# Patient Record
Sex: Female | Born: 1960
Health system: Southern US, Community
[De-identification: ages and names within clinical notes are randomized; demographics above are authoritative.]

## PROBLEM LIST (undated history)

## (undated) DIAGNOSIS — G43909 Migraine, unspecified, not intractable, without status migrainosus: Secondary | ICD-10-CM

## (undated) DIAGNOSIS — J45909 Unspecified asthma, uncomplicated: Secondary | ICD-10-CM

## (undated) DIAGNOSIS — D699 Hemorrhagic condition, unspecified: Secondary | ICD-10-CM

## (undated) DIAGNOSIS — F411 Generalized anxiety disorder: Secondary | ICD-10-CM

## (undated) DIAGNOSIS — F329 Major depressive disorder, single episode, unspecified: Secondary | ICD-10-CM

## (undated) DIAGNOSIS — E785 Hyperlipidemia, unspecified: Secondary | ICD-10-CM

## (undated) DIAGNOSIS — J309 Allergic rhinitis, unspecified: Secondary | ICD-10-CM

## (undated) DIAGNOSIS — D509 Iron deficiency anemia, unspecified: Secondary | ICD-10-CM

## (undated) DIAGNOSIS — K439 Ventral hernia without obstruction or gangrene: Secondary | ICD-10-CM

## (undated) HISTORY — DX: Ventral hernia without obstruction or gangrene: K43.9

## (undated) HISTORY — DX: Hemorrhagic condition, unspecified: D69.9

## (undated) HISTORY — DX: Allergic rhinitis, unspecified: J30.9

## (undated) HISTORY — PX: COLONOSCOPY: SHX174

## (undated) HISTORY — DX: Migraine, unspecified, not intractable, without status migrainosus: G43.909

## (undated) HISTORY — PX: ABDOMINAL HYSTERECTOMY: SHX81

## (undated) HISTORY — DX: Hyperlipidemia, unspecified: E78.5

## (undated) HISTORY — DX: Iron deficiency anemia, unspecified: D50.9

## (undated) HISTORY — DX: Unspecified asthma, uncomplicated: J45.909

## (undated) HISTORY — DX: Generalized anxiety disorder: F41.1

## (undated) HISTORY — DX: Major depressive disorder, single episode, unspecified: F32.9

---

## 1998-05-23 ENCOUNTER — Ambulatory Visit (HOSPITAL_COMMUNITY): Admission: RE | Admit: 1998-05-23 | Discharge: 1998-05-23 | Payer: Self-pay | Admitting: Obstetrics & Gynecology

## 1998-05-23 ENCOUNTER — Encounter: Payer: Self-pay | Admitting: Obstetrics & Gynecology

## 1999-05-09 ENCOUNTER — Other Ambulatory Visit: Admission: RE | Admit: 1999-05-09 | Discharge: 1999-05-09 | Payer: Self-pay | Admitting: Obstetrics and Gynecology

## 1999-12-21 ENCOUNTER — Encounter: Payer: Self-pay | Admitting: Obstetrics & Gynecology

## 1999-12-21 ENCOUNTER — Ambulatory Visit (HOSPITAL_COMMUNITY): Admission: RE | Admit: 1999-12-21 | Discharge: 1999-12-21 | Payer: Self-pay | Admitting: Obstetrics & Gynecology

## 2000-09-16 ENCOUNTER — Other Ambulatory Visit: Admission: RE | Admit: 2000-09-16 | Discharge: 2000-09-16 | Payer: Self-pay | Admitting: Obstetrics and Gynecology

## 2001-12-18 ENCOUNTER — Other Ambulatory Visit: Admission: RE | Admit: 2001-12-18 | Discharge: 2001-12-18 | Payer: Self-pay | Admitting: Obstetrics and Gynecology

## 2002-04-27 ENCOUNTER — Encounter (INDEPENDENT_AMBULATORY_CARE_PROVIDER_SITE_OTHER): Payer: Self-pay | Admitting: Specialist

## 2002-04-27 ENCOUNTER — Observation Stay (HOSPITAL_COMMUNITY): Admission: RE | Admit: 2002-04-27 | Discharge: 2002-04-28 | Payer: Self-pay | Admitting: Obstetrics and Gynecology

## 2002-07-19 ENCOUNTER — Ambulatory Visit (HOSPITAL_COMMUNITY): Admission: RE | Admit: 2002-07-19 | Discharge: 2002-07-19 | Payer: Self-pay | Admitting: Obstetrics and Gynecology

## 2002-07-19 ENCOUNTER — Encounter: Payer: Self-pay | Admitting: Obstetrics and Gynecology

## 2003-03-22 ENCOUNTER — Other Ambulatory Visit: Admission: RE | Admit: 2003-03-22 | Discharge: 2003-03-22 | Payer: Self-pay | Admitting: Obstetrics and Gynecology

## 2004-05-14 ENCOUNTER — Ambulatory Visit: Payer: Self-pay | Admitting: Internal Medicine

## 2005-05-16 ENCOUNTER — Ambulatory Visit: Payer: Self-pay | Admitting: Internal Medicine

## 2005-05-21 ENCOUNTER — Ambulatory Visit: Payer: Self-pay | Admitting: Internal Medicine

## 2005-07-17 ENCOUNTER — Ambulatory Visit: Payer: Self-pay | Admitting: Internal Medicine

## 2005-09-30 ENCOUNTER — Ambulatory Visit: Payer: Self-pay | Admitting: Internal Medicine

## 2005-10-02 ENCOUNTER — Encounter: Admission: RE | Admit: 2005-10-02 | Discharge: 2005-10-02 | Payer: Self-pay | Admitting: Internal Medicine

## 2006-02-03 ENCOUNTER — Ambulatory Visit: Payer: Self-pay | Admitting: Internal Medicine

## 2006-10-07 ENCOUNTER — Ambulatory Visit (HOSPITAL_COMMUNITY): Admission: RE | Admit: 2006-10-07 | Discharge: 2006-10-07 | Payer: Self-pay | Admitting: Internal Medicine

## 2006-10-07 DIAGNOSIS — E669 Obesity, unspecified: Secondary | ICD-10-CM | POA: Insufficient documentation

## 2006-10-13 DIAGNOSIS — J45909 Unspecified asthma, uncomplicated: Secondary | ICD-10-CM | POA: Insufficient documentation

## 2006-10-13 DIAGNOSIS — J309 Allergic rhinitis, unspecified: Secondary | ICD-10-CM | POA: Insufficient documentation

## 2006-10-13 DIAGNOSIS — F411 Generalized anxiety disorder: Secondary | ICD-10-CM | POA: Insufficient documentation

## 2006-10-13 DIAGNOSIS — Z860100 Personal history of colon polyps, unspecified: Secondary | ICD-10-CM | POA: Insufficient documentation

## 2006-10-13 DIAGNOSIS — D509 Iron deficiency anemia, unspecified: Secondary | ICD-10-CM

## 2006-10-13 DIAGNOSIS — F329 Major depressive disorder, single episode, unspecified: Secondary | ICD-10-CM

## 2006-10-13 DIAGNOSIS — E785 Hyperlipidemia, unspecified: Secondary | ICD-10-CM | POA: Insufficient documentation

## 2006-10-13 DIAGNOSIS — F418 Other specified anxiety disorders: Secondary | ICD-10-CM | POA: Insufficient documentation

## 2006-10-13 DIAGNOSIS — F3289 Other specified depressive episodes: Secondary | ICD-10-CM

## 2006-10-13 HISTORY — DX: Unspecified asthma, uncomplicated: J45.909

## 2006-10-13 HISTORY — DX: Major depressive disorder, single episode, unspecified: F32.9

## 2006-10-13 HISTORY — DX: Generalized anxiety disorder: F41.1

## 2006-10-13 HISTORY — DX: Iron deficiency anemia, unspecified: D50.9

## 2006-10-13 HISTORY — DX: Other specified depressive episodes: F32.89

## 2006-10-13 HISTORY — DX: Hyperlipidemia, unspecified: E78.5

## 2006-10-13 HISTORY — DX: Allergic rhinitis, unspecified: J30.9

## 2006-10-16 ENCOUNTER — Ambulatory Visit: Payer: Self-pay | Admitting: Internal Medicine

## 2007-05-04 ENCOUNTER — Ambulatory Visit: Payer: Self-pay | Admitting: Internal Medicine

## 2007-05-04 DIAGNOSIS — I8000 Phlebitis and thrombophlebitis of superficial vessels of unspecified lower extremity: Secondary | ICD-10-CM | POA: Insufficient documentation

## 2007-12-02 ENCOUNTER — Ambulatory Visit (HOSPITAL_COMMUNITY): Admission: RE | Admit: 2007-12-02 | Discharge: 2007-12-02 | Payer: Self-pay | Admitting: Internal Medicine

## 2007-12-09 ENCOUNTER — Encounter: Payer: Self-pay | Admitting: Internal Medicine

## 2007-12-11 ENCOUNTER — Encounter: Admission: RE | Admit: 2007-12-11 | Discharge: 2007-12-11 | Payer: Self-pay | Admitting: Internal Medicine

## 2007-12-11 ENCOUNTER — Encounter: Payer: Self-pay | Admitting: Internal Medicine

## 2008-01-29 ENCOUNTER — Telehealth: Payer: Self-pay | Admitting: Internal Medicine

## 2008-10-03 ENCOUNTER — Telehealth: Payer: Self-pay | Admitting: Internal Medicine

## 2008-10-05 ENCOUNTER — Ambulatory Visit: Payer: Self-pay | Admitting: Internal Medicine

## 2008-10-05 DIAGNOSIS — N61 Mastitis without abscess: Secondary | ICD-10-CM | POA: Insufficient documentation

## 2008-10-17 ENCOUNTER — Telehealth: Payer: Self-pay | Admitting: Internal Medicine

## 2008-12-07 ENCOUNTER — Ambulatory Visit (HOSPITAL_COMMUNITY): Admission: RE | Admit: 2008-12-07 | Discharge: 2008-12-07 | Payer: Self-pay | Admitting: Internal Medicine

## 2009-06-28 ENCOUNTER — Ambulatory Visit: Payer: Self-pay | Admitting: Internal Medicine

## 2009-06-28 LAB — CONVERTED CEMR LAB
Albumin: 4.1 g/dL (ref 3.5–5.2)
Basophils Absolute: 0 10*3/uL (ref 0.0–0.1)
Basophils Relative: 0.9 % (ref 0.0–3.0)
CO2: 30 meq/L (ref 19–32)
Calcium: 9.6 mg/dL (ref 8.4–10.5)
Chloride: 103 meq/L (ref 96–112)
Creatinine, Ser: 0.6 mg/dL (ref 0.4–1.2)
Eosinophils Absolute: 0.1 10*3/uL (ref 0.0–0.7)
Glucose, Bld: 80 mg/dL (ref 70–99)
HDL: 43.4 mg/dL (ref >39.00)
Ketones, ur: NEGATIVE mg/dL
MCHC: 34.2 g/dL (ref 30.0–36.0)
MCV: 87.9 fL (ref 78.0–100.0)
Monocytes Absolute: 0.4 10*3/uL (ref 0.1–1.0)
Neutro Abs: 2.6 10*3/uL (ref 1.4–7.7)
Neutrophils Relative %: 60 % (ref 43.0–77.0)
RBC: 4.37 M/uL (ref 3.87–5.11)
RDW: 13.4 % (ref 11.5–14.6)
Specific Gravity, Urine: >=1.03 (ref 1.000–1.030)
Total Protein, Urine: NEGATIVE mg/dL
Total Protein: 6.8 g/dL (ref 6.0–8.3)
Triglycerides: 53 mg/dL (ref 0.0–149.0)
Urine Glucose: NEGATIVE mg/dL
VLDL: 10.6 mg/dL (ref 0.0–40.0)
pH: 5.5 (ref 5.0–8.0)

## 2009-11-10 ENCOUNTER — Ambulatory Visit: Payer: Self-pay | Admitting: Internal Medicine

## 2009-11-10 DIAGNOSIS — M25519 Pain in unspecified shoulder: Secondary | ICD-10-CM | POA: Insufficient documentation

## 2009-11-10 DIAGNOSIS — M25529 Pain in unspecified elbow: Secondary | ICD-10-CM | POA: Insufficient documentation

## 2009-12-27 ENCOUNTER — Ambulatory Visit (HOSPITAL_COMMUNITY): Admission: RE | Admit: 2009-12-27 | Discharge: 2009-12-27 | Payer: Self-pay | Admitting: Internal Medicine

## 2010-01-16 ENCOUNTER — Encounter: Payer: Self-pay | Admitting: Internal Medicine

## 2010-01-17 ENCOUNTER — Encounter: Admission: RE | Admit: 2010-01-17 | Discharge: 2010-01-17 | Payer: Self-pay | Admitting: Internal Medicine

## 2010-01-17 LAB — HM MAMMOGRAPHY: HM Mammogram: NORMAL

## 2010-02-26 ENCOUNTER — Telehealth: Payer: Self-pay | Admitting: Internal Medicine

## 2010-03-11 ENCOUNTER — Encounter: Payer: Self-pay | Admitting: Internal Medicine

## 2010-03-16 ENCOUNTER — Ambulatory Visit
Admission: RE | Admit: 2010-03-16 | Discharge: 2010-03-16 | Payer: Self-pay | Source: Home / Self Care | Attending: Internal Medicine | Admitting: Internal Medicine

## 2010-03-16 ENCOUNTER — Encounter: Payer: Self-pay | Admitting: Internal Medicine

## 2010-03-16 DIAGNOSIS — R002 Palpitations: Secondary | ICD-10-CM | POA: Insufficient documentation

## 2010-03-16 DIAGNOSIS — R350 Frequency of micturition: Secondary | ICD-10-CM | POA: Insufficient documentation

## 2010-03-16 DIAGNOSIS — R21 Rash and other nonspecific skin eruption: Secondary | ICD-10-CM | POA: Insufficient documentation

## 2010-03-16 LAB — CONVERTED CEMR LAB
Blood in Urine, dipstick: NEGATIVE
Glucose, Urine, Semiquant: NEGATIVE
Ketones, urine, test strip: NEGATIVE
Protein, U semiquant: NEGATIVE
Specific Gravity, Urine: 1.01
WBC Urine, dipstick: NEGATIVE

## 2010-03-19 ENCOUNTER — Telehealth: Payer: Self-pay | Admitting: Internal Medicine

## 2010-03-19 DIAGNOSIS — N644 Mastodynia: Secondary | ICD-10-CM | POA: Insufficient documentation

## 2010-03-19 DIAGNOSIS — N6009 Solitary cyst of unspecified breast: Secondary | ICD-10-CM | POA: Insufficient documentation

## 2010-03-20 NOTE — Assessment & Plan Note (Signed)
Summary: feels like she has "irregular" heartbeat/cd   Vital Signs:  Patient profile:   50 year old female Height:      65 inches Weight:      163 pounds BMI:     27.22 O2 Sat:      99 % on Room air Temp:     98.3 degrees F oral Pulse rate:   62 / minute Pulse rhythm:   regular BP sitting:   102 / 64  (left arm) Cuff size:   regular  Vitals Entered By: Bill Salinas CMA (Jun 28, 2009 10:56 AM)  O2 Flow:  Room air  Preventive Care Screening     declines tetanus today; plans to cont yearly mammograms  CC: pt here with compliant of heart beating irregular or palpatations when angry or anxious/ ab   CC:  pt here with compliant of heart beating irregular or palpatations when angry or anxious/ ab.  History of Present Illness: here with above, with emot upset more often recent due to family stress increased recently;  no symptoms at other ties; same symtpoms better with xanax a few yrs ago;  Pt denies CP, sob, doe, wheezing, orthopnea, pnd, worsening LE edema,  or syncope Pt denies new neuro symptoms such as headache, facial or extremity weakness   No other new complaints.  Denies overt bleeding, or other hyper or hypothyroid symtpoms.  Denies worsening depressive symtpoms, suicidal ideation or panic.    Preventive Screening-Counseling & Management  Alcohol-Tobacco     Smoking Status: never      Drug Use:  no.    Problems Prior to Update: 1)  Preventive Health Care  (ICD-V70.0) 2)  Abscess, Breast, Right  (ICD-611.0) 3)  Superficial Thrombophlebitis  (ICD-451.0) 4)  Depression  (ICD-311) 5)  Colonic Polyps, Hx of  (ICD-V12.72) 6)  Asthma  (ICD-493.90) 7)  Anxiety  (ICD-300.00) 8)  Allergic Rhinitis  (ICD-477.9) 9)  Hyperlipidemia  (ICD-272.4) 10)  Anemia-iron Deficiency  (ICD-280.9) 11)  Obesity  (ICD-278.00)  Medications Prior to Update: 1)  Doxycycline Hyclate 100 Mg Caps (Doxycycline Hyclate) .Marland Kitchen.. 1po Two Times A Day  Current Medications (verified): 1)  Alprazolam  0.25 Mg Tabs (Alprazolam) .Marland Kitchen.. 1 By Mouth Two Times A Day As Needed  Allergies (verified): 1)  ! Aspirin 2)  Zoloft  Comments:  Nurse/Medical Assistant: The patient's medications were reviewed with the patient and were updated in the Medication List. Ami Bullins CMA (Jun 28, 2009 10:56 AM)  Past History:  Past Medical History: Last updated: 10/07/2006 Anemia-iron deficiency Hyperlipidemia Allergic rhinitis Anxiety Asthma Colonic polyps, hx of Depression  Past Surgical History: Last updated: 10/07/2006 Hysterectomy  Family History: Last updated: 06/28/2009 no DVT HTN  Social History: Last updated: 06/28/2009 Married has children Never Smoked Alcohol use-yes - social Drug use-no  Risk Factors: Smoking Status: never (06/28/2009)  Family History: Reviewed history from 05/04/2007 and no changes required. no DVT HTN  Social History: Reviewed history from 10/05/2008 and no changes required. Married has children Never Smoked Alcohol use-yes - social Drug use-no Drug Use:  no  Review of Systems  The patient denies anorexia, fever, weight loss, weight gain, vision loss, decreased hearing, hoarseness, chest pain, syncope, dyspnea on exertion, peripheral edema, prolonged cough, headaches, hemoptysis, abdominal pain, melena, hematochezia, severe indigestion/heartburn, hematuria, muscle weakness, suspicious skin lesions, transient blindness, difficulty walking, depression, unusual weight change, abnormal bleeding, enlarged lymph nodes, angioedema, and breast masses.         all otherwise negative per pt -  Physical Exam  General:  alert and well-developed.   Head:  normocephalic and atraumatic.   Eyes:  vision grossly intact, pupils equal, and pupils round.   Ears:  R ear normal and L ear normal.   Nose:  no external deformity and no nasal discharge.   Mouth:  no gingival abnormalities and pharynx pink and moist.   Neck:  supple and no masses.   Lungs:   normal respiratory effort and normal breath sounds.   Heart:  normal rate and regular rhythm.   Abdomen:  soft, non-tender, and normal bowel sounds.   Msk:  no joint tenderness and no joint swelling.   Extremities:  no edema, no erythema  Neurologic:  cranial nerves II-XII intact and strength normal in all extremities.     Impression & Recommendations:  Problem # 1:  Preventive Health Care (ICD-V70.0)  Overall doing well, age appropriate education and counseling updated and referral for appropriate preventive services done unless declined, immunizations up to date or declined, diet counseling done if overweight, urged to quit smoking if smokes , most recent labs reviewed and current ordered if appropriate, ecg reviewed or declined (interpretation per ECG scanned in the EMR if done); information regarding Medicare Prevention requirements given if appropriate; speciality referrals updated as appropriate   Orders: EKG w/ Interpretation (93000) T-Vitamin D (25-Hydroxy) (04540-98119) TLB-BMP (Basic Metabolic Panel-BMET) (80048-METABOL) TLB-CBC Platelet - w/Differential (85025-CBCD) TLB-Hepatic/Liver Function Pnl (80076-HEPATIC) TLB-Lipid Panel (80061-LIPID) TLB-TSH (Thyroid Stimulating Hormone) (84443-TSH) TLB-Udip ONLY (81003-UDIP)  Problem # 2:  ANXIETY (ICD-300.00)  Her updated medication list for this problem includes:    Alprazolam 0.25 Mg Tabs (Alprazolam) .Marland Kitchen... 1 by mouth two times a day as needed treat as above, f/u any worsening signs or symptoms , declines ssri or counseling  Complete Medication List: 1)  Alprazolam 0.25 Mg Tabs (Alprazolam) .Marland Kitchen.. 1 by mouth two times a day as needed  Patient Instructions: 1)  Please take all new medications as prescribed 2)  Your EKG was good today 3)  Please go to the Lab in the basement for your blood and/or urine tests today   4)  Please schedule a follow-up appointment in 1 year or sooner if needed Prescriptions: ALPRAZOLAM 0.25 MG  TABS (ALPRAZOLAM) 1 by mouth two times a day as needed  #60 x 2   Entered and Authorized by:   Corwin Levins MD   Signed by:   Corwin Levins MD on 06/28/2009   Method used:   Print then Give to Patient   RxID:   [id number]

## 2010-03-20 NOTE — Assessment & Plan Note (Signed)
Summary: SHOULDER PAIN---STC   Vital Signs:  Patient profile:   50 year old female Height:      65 inches Weight:      165.25 pounds BMI:     27.60 O2 Sat:      99 % on Room air Temp:     98.5 degrees F oral Pulse rate:   66 / minute BP sitting:   110 / 72  (left arm) Cuff size:   regular  Vitals Entered By: Zella Ball Ewing CMA Duncan Dull) (November 10, 2009 4:49 PM)  O2 Flow:  Room air CC: Left shoulder and neck pain,/RE   CC:  Left shoulder and neck pain and /RE.  History of Present Illness: here for f/u - c/o mod to severe pain to the left shoulder after cleaning the floor of the shower adn spending significant time with outstretched left arm for support;  did not seem to feel any particular point of pain to start, but since then has had ongoing constant severe pain, and inablitlty to significantly forward elevate or abduct due to pain; no sweling or other fall or injury;  has some pain radiate to the upper back but no significant pain to the neck or radicular pain; no distal pain, weakness, numbness.  Pt denies CP, worsening sob, doe, wheezing, orthopnea, pnd, worsening LE edema, palps, dizziness or syncope  Pt denies new neuro symptoms such as headache, facial or extremity weakness  Denies polydipsia or polyuria.   Also with 4 to 6 wks singificant depressive symptoms wihtout suicidal ideation or panic.  Has ongoing significant social stressors but no panic.   Also wtih mild pain to the right elbow without swelling  for several weeks as well but no right neck pain ro RUE other pain/weak/numk  Problems Prior to Update: 1)  Shoulder Pain, Left  (ICD-719.41) 2)  Preventive Health Care  (ICD-V70.0) 3)  Abscess, Breast, Right  (ICD-611.0) 4)  Superficial Thrombophlebitis  (ICD-451.0) 5)  Depression  (ICD-311) 6)  Colonic Polyps, Hx of  (ICD-V12.72) 7)  Asthma  (ICD-493.90) 8)  Anxiety  (ICD-300.00) 9)  Allergic Rhinitis  (ICD-477.9) 10)  Hyperlipidemia  (ICD-272.4) 11)  Anemia-iron  Deficiency  (ICD-280.9) 12)  Obesity  (ICD-278.00)  Medications Prior to Update: 1)  Alprazolam 0.25 Mg Tabs (Alprazolam) .Marland Kitchen.. 1 By Mouth Two Times A Day As Needed  Current Medications (verified): 1)  Alprazolam 0.25 Mg Tabs (Alprazolam) .Marland Kitchen.. 1 By Mouth Two Times A Day As Needed 2)  Hydrocodone-Acetaminophen 7.5-325 Mg Tabs (Hydrocodone-Acetaminophen) .Marland Kitchen.. 1po Q 6 Hrs As Needed Pain 3)  Flexeril 5 Mg Tabs (Cyclobenzaprine Hcl) .Marland Kitchen.. 1po Three Times A Day As Needed 4)  Fluoxetine Hcl 20 Mg Caps (Fluoxetine Hcl) .Marland Kitchen.. 1po Once Daily  Allergies (verified): 1)  ! Aspirin 2)  Zoloft  Past History:  Past Medical History: Last updated: 10/07/2006 Anemia-iron deficiency Hyperlipidemia Allergic rhinitis Anxiety Asthma Colonic polyps, hx of Depression  Past Surgical History: Last updated: 10/07/2006 Hysterectomy  Social History: Last updated: 06/28/2009 Married has children Never Smoked Alcohol use-yes - social Drug use-no  Risk Factors: Smoking Status: never (06/28/2009)  Review of Systems       all otherwise negative per pt -    Physical Exam  General:  alert and overweight-appearing.   Head:  normocephalic and atraumatic.   Eyes:  vision grossly intact, pupils equal, and pupils round.   Ears:  R ear normal and L ear normal.   Nose:  no external deformity and no nasal discharge.  Mouth:  no gingival abnormalities and pharynx pink and moist.   Neck:  supple and no masses.   Lungs:  normal respiratory effort and normal breath sounds.   Heart:  normal rate and regular rhythm.   Msk:  left  shoulder nontender, nonswollen but with marked pain on forward elev and abduction to 20 degrees only;  also mild tender to right lateral epicondylar area without sweling or erthema or rash  Extremities:  no edema, no erythema  Neurologic:  cranial nerves II-XII intact, strength normal in all extremities, sensation intact to light touch, gait normal, and DTRs symmetrical and normal.     Skin:  color normal and no rashes.   Psych:  depressed affect and moderately anxious.     Impression & Recommendations:  Problem # 1:  SHOULDER PAIN, LEFT (ICD-719.41)  c/w severe sprain vs bursitis/tendonitis vs tear vs other (I suspect ? tear) - pt wants to be conservative to start - for pain med and muscle relaxer for now, declines prednisone; consdier ortho in 1 wk if no improvement  Her updated medication list for this problem includes:    Hydrocodone-acetaminophen 7.5-325 Mg Tabs (Hydrocodone-acetaminophen) .Marland Kitchen... 1po q 6 hrs as needed pain    Flexeril 5 Mg Tabs (Cyclobenzaprine hcl) .Marland Kitchen... 1po three times a day as needed  Problem # 2:  DEPRESSION (ICD-311)  Her updated medication list for this problem includes:    Alprazolam 0.25 Mg Tabs (Alprazolam) .Marland Kitchen... 1 by mouth two times a day as needed    Fluoxetine Hcl 20 Mg Caps (Fluoxetine hcl) .Marland Kitchen... 1po once daily treat as above, f/u any worsening signs or symptoms , declines counseling or other psych evaluation at this time  Problem # 5:  ELBOW PAIN, RIGHT (ICD-719.42) exam c/w right lateral epicondylitits , mild, d/w pt - for forearm band and tylenol as needed   Complete Medication List: 1)  Alprazolam 0.25 Mg Tabs (Alprazolam) .Marland Kitchen.. 1 by mouth two times a day as needed 2)  Hydrocodone-acetaminophen 7.5-325 Mg Tabs (Hydrocodone-acetaminophen) .Marland Kitchen.. 1po q 6 hrs as needed pain 3)  Flexeril 5 Mg Tabs (Cyclobenzaprine hcl) .Marland Kitchen.. 1po three times a day as needed 4)  Fluoxetine Hcl 20 Mg Caps (Fluoxetine hcl) .Marland Kitchen.. 1po once daily  Patient Instructions: 1)  Please take all new medications as prescribed  - the pain medication, muscles relaxer , and generic prozac 2)  Continue all previous medications as before this visit  3)  Please call in 1 wk if no better for orthopedic referral 4)  Please call in 3 to 4 wks if you feel you need higher strength of the generic prozac 5)  Please schedule a follow-up appointment in may 2012 for yearly exam,  or sooner if needed Prescriptions: FLUOXETINE HCL 20 MG CAPS (FLUOXETINE HCL) 1po once daily  #90 x 3   Entered and Authorized by:   Corwin Levins MD   Signed by:   Corwin Levins MD on 11/10/2009   Method used:   Print then Give to Patient   RxID:   [id number] FLEXERIL 5 MG TABS (CYCLOBENZAPRINE HCL) 1po three times a day as needed  #60 x 1   Entered and Authorized by:   Corwin Levins MD   Signed by:   Corwin Levins MD on 11/10/2009   Method used:   Print then Give to Patient   RxID:   [id number] HYDROCODONE-ACETAMINOPHEN 7.5-325 MG TABS (HYDROCODONE-ACETAMINOPHEN) 1po q 6 hrs as needed pain  #60 x 1  Entered and Authorized by:   Corwin Levins MD   Signed by:   Corwin Levins MD on 11/10/2009   Method used:   Print then Give to Patient   RxID:   [id number]

## 2010-03-21 ENCOUNTER — Other Ambulatory Visit: Payer: Self-pay | Admitting: Internal Medicine

## 2010-03-22 NOTE — Assessment & Plan Note (Signed)
Summary: SKIN RASH/ IRR HEART BEAT AT TIMES FOR A WHILE /NWS   Vital Signs:  Patient profile:   50 year old female Height:      65 inches Weight:      167 pounds BMI:     27.89 O2 Sat:      99 % on Room air Temp:     98.7 degrees F oral Pulse rate:   84 / minute BP sitting:   120 / 82  (left arm) Cuff size:   regular  Vitals Entered By: Zella Ball Ewing CMA (AAMA) (March 16, 2010 3:04 PM)  O2 Flow:  Room air CC: Frequent urinating, Eczema, irregular heart beat/RE   CC:  Frequent urinating, Eczema, and irregular heart beat/RE.  History of Present Illness: here for f/u - overall doing ok, but c/o 3 days urinary freq, low abd discomfort but no urgency, dysuria, back pain, n/v, fever.  Pt denies CP, worsening sob, doe, wheezing, orthopnea, pnd, worsening LE edema, dizziness or syncope  Pt denies new neuro symptoms such as headache, facial or extremity weakness  Pt denies polydipsia, polyuria  Overall good compliance with meds, trying to follow low chol diet, wt stable, little excercise however .  Does have rash to the bilat ant neck, worse on the left side, c/w similar rash in the past , but not better with OTC cortaid type meds.  Denies worsening depressive symptoms, suicidal ideation, or panic. , though has some ongoing work stress and anxiety;  Has recurring palpitations intermittent despite cutting out caffeine for several wks that bothers her. No fever, wt loss, night sweats, loss of appetite or other constitutional symptoms   Problems Prior to Update: 1)  Palpitations, Recurrent  (ICD-785.1) 2)  Rash-nonvesicular  (ICD-782.1) 3)  Frequency, Urinary  (ICD-788.41) 4)  Elbow Pain, Right  (ICD-719.42) 5)  Shoulder Pain, Left  (ICD-719.41) 6)  Preventive Health Care  (ICD-V70.0) 7)  Abscess, Breast, Right  (ICD-611.0) 8)  Superficial Thrombophlebitis  (ICD-451.0) 9)  Depression  (ICD-311) 10)  Colonic Polyps, Hx of  (ICD-V12.72) 11)  Asthma  (ICD-493.90) 12)  Anxiety   (ICD-300.00) 13)  Allergic Rhinitis  (ICD-477.9) 14)  Hyperlipidemia  (ICD-272.4) 15)  Anemia-iron Deficiency  (ICD-280.9) 16)  Obesity  (ICD-278.00)  Medications Prior to Update: 1)  Alprazolam 0.25 Mg Tabs (Alprazolam) .Marland Kitchen.. 1 By Mouth Two Times A Day As Needed 2)  Hydrocodone-Acetaminophen 7.5-325 Mg Tabs (Hydrocodone-Acetaminophen) .Marland Kitchen.. 1po Q 6 Hrs As Needed Pain 3)  Flexeril 5 Mg Tabs (Cyclobenzaprine Hcl) .Marland Kitchen.. 1po Three Times A Day As Needed 4)  Fluoxetine Hcl 20 Mg Caps (Fluoxetine Hcl) .Marland Kitchen.. 1po Once Daily  Current Medications (verified): 1)  Metoprolol Tartrate 25 Mg Tabs (Metoprolol Tartrate) .... 1/2 - 1 By Mouth Two Times A Day As Needed Palpitations 2)  Triamcinolone Acetonide 0.1 % Crea (Triamcinolone Acetonide) .... Use Asd Two Times A Day As Needed Rash 3)  Ciprofloxacin Hcl 500 Mg Tabs (Ciprofloxacin Hcl) .Marland Kitchen.. 1po Two Times A Day  Allergies (verified): 1)  ! Aspirin 2)  Zoloft  Past History:  Past Medical History: Last updated: 10/07/2006 Anemia-iron deficiency Hyperlipidemia Allergic rhinitis Anxiety Asthma Colonic polyps, hx of Depression  Past Surgical History: Last updated: 10/07/2006 Hysterectomy  Social History: Last updated: 06/28/2009 Married has children Never Smoked Alcohol use-yes - social Drug use-no  Risk Factors: Smoking Status: never (06/28/2009)  Review of Systems       all otherwise negative per pt -    Physical Exam  General:  alert and overweight-appearing.   Head:  normocephalic and atraumatic.   Eyes:  vision grossly intact, pupils equal, and pupils round.   Ears:  R ear normal and L ear normal.   Nose:  no external deformity and no nasal discharge.   Mouth:  no gingival abnormalities and pharynx pink and moist.   Neck:  supple and no masses.   Lungs:  normal respiratory effort and normal breath sounds.   Heart:  normal rate and regular rhythm.   Abdomen:  soft and normal bowel sounds.  with mild low mid abd  tender Msk:  no flank tender bilat Extremities:  no edema, no erythema  Skin:  bilat lower ant neck with 2 x3 cm area each somewhat scaly erythem areas c/w exczema   Impression & Recommendations:  Problem # 1:  FREQUENCY, URINARY (ICD-788.41)  prob uti - to check urine studies, tx with cipro course, to f/u any worsening symptoms Orders: T-Culture, Urine ([id number])  Discussed use of medication.   Problem # 2:  RASH-NONVESICULAR (ICD-782.1)  Her updated medication list for this problem includes:    Triamcinolone Acetonide 0.1 % Crea (Triamcinolone acetonide) ..... Use asd two times a day as needed rash  Discussed medication use and symptom control.  treat as above, f/u any worsening signs or symptoms  - rash most c/w eczema  Problem # 3:  PALPITATIONS, RECURRENT (ICD-785.1)  Her updated medication list for this problem includes:    Metoprolol Tartrate 25 Mg Tabs (Metoprolol tartrate) .Marland Kitchen... 1/2 - 1 by mouth two times a day as needed palpitations  Avoid caffeine, chocolate, and stimulants. Stress reduction as well as medication options discussed.   Problem # 4:  ANXIETY (ICD-300.00)  The following medications were removed from the medication list:    Alprazolam 0.25 Mg Tabs (Alprazolam) .Marland Kitchen... 1 by mouth two times a day as needed    Fluoxetine Hcl 20 Mg Caps (Fluoxetine hcl) .Marland Kitchen... 1po once daily  Discussed medication use and relaxation techniques.  Declines further meds at this time, or counseling  Complete Medication List: 1)  Metoprolol Tartrate 25 Mg Tabs (Metoprolol tartrate) .... 1/2 - 1 by mouth two times a day as needed palpitations 2)  Triamcinolone Acetonide 0.1 % Crea (Triamcinolone acetonide) .... Use asd two times a day as needed rash 3)  Ciprofloxacin Hcl 500 Mg Tabs (Ciprofloxacin hcl) .Marland Kitchen.. 1po two times a day  Patient Instructions: 1)  you urine will be sent for culture 2)  Please take all new medications as prescribed 3)  Continue all previous medications  as before this visit  4)  Please schedule a follow-up appointment in  - May 2012 for CPX with labs Prescriptions: CIPROFLOXACIN HCL 500 MG TABS (CIPROFLOXACIN HCL) 1po two times a day  #14 x 0   Entered and Authorized by:   Corwin Levins MD   Signed by:   Corwin Levins MD on 03/16/2010   Method used:   Print then Give to Patient   RxID:   [id number] TRIAMCINOLONE ACETONIDE 0.1 % CREA (TRIAMCINOLONE ACETONIDE) use asd two times a day as needed rash  #1large x 1   Entered and Authorized by:   Corwin Levins MD   Signed by:   Corwin Levins MD on 03/16/2010   Method used:   Print then Give to Patient   RxID:   [id number] METOPROLOL TARTRATE 25 MG TABS (METOPROLOL TARTRATE) 1/2 - 1 by mouth two times a day as needed palpitations  #06  x 5   Entered and Authorized by:   Corwin Levins MD   Signed by:   Corwin Levins MD on 03/16/2010   Method used:   Print then Give to Patient   RxID:   [id number]    Orders Added: 1)  T-Culture, Urine [[id number]] 2)  Est. Patient Level IV [30865]    Laboratory Results   Urine Tests    Routine Urinalysis   Color: yellow Appearance: Clear Glucose: negative   (Normal Range: Negative) Bilirubin: negative   (Normal Range: Negative) Ketone: negative   (Normal Range: Negative) Spec. Gravity: 1.010   (Normal Range: 1.003-1.035) Blood: negative   (Normal Range: Negative) pH: 7.0   (Normal Range: 5.0-8.0) Protein: negative   (Normal Range: Negative) Urobilinogen: 0.2   (Normal Range: 0-1) Nitrite: negative   (Normal Range: Negative) Leukocyte Esterace: negative   (Normal Range: Negative)

## 2010-03-22 NOTE — Progress Notes (Signed)
Summary: referral  Phone Note Call from Patient   Caller: Patient [phone] Summary of Call: Pt called requesting referral to Cardiology for irregular heartbeat Initial call taken by: Margaret Pyle, CMA,  February 26, 2010 1:29 PM  Follow-up for Phone Call        can she make ROV first to look into further? Follow-up by: Corwin Levins MD,  February 26, 2010 1:48 PM  Additional Follow-up for Phone Call Additional follow up Details #1::        Pt advised and agreed. She will call back to schedule appt with JWJ Additional Follow-up by: Margaret Pyle, CMA,  February 26, 2010 2:41 PM

## 2010-03-28 NOTE — Progress Notes (Signed)
Summary: Breast pain  Phone Note Call from Patient Call back at Home Phone [phone]   Caller: Patient Summary of Call: Pt called stating she is having severe RT breast tenderness and sometimes pain. Does pt need eval at [facility] or [facility]? Initial call taken by: Margaret Pyle, CMA,  March 19, 2010 11:09 AM  Follow-up for Phone Call        since she just had eval that showed cyst on the right, I think it would be reasonalbe to call the [facility] to see if they can see her, as they would most likely be able to alleviate the pain if it is coming from the cyst that was just seen nov 2011 Follow-up by: Corwin Levins MD,  March 19, 2010 12:27 PM  Additional Follow-up for Phone Call Additional follow up Details #1::        Pt advised and requests a referral per [facility] Additional Follow-up by: Margaret Pyle, CMA,  March 19, 2010 1:26 PM  New Problems: BREAST CYST, RIGHT (ICD-610.0) BREAST PAIN, RIGHT (ICD-611.71)   Additional Follow-up for Phone Call Additional follow up Details #2::    ok for referral Follow-up by: Corwin Levins MD,  March 19, 2010 2:14 PM  Additional Follow-up for Phone Call Additional follow up Details #3:: Details for Additional Follow-up Action Taken: Pt will expect a call from [facility] Additional Follow-up by: Margaret Pyle, CMA,  March 19, 2010 2:30 PM  New Problems: BREAST CYST, RIGHT (ICD-610.0) BREAST PAIN, RIGHT (ICD-611.71)

## 2010-04-05 ENCOUNTER — Ambulatory Visit (INDEPENDENT_AMBULATORY_CARE_PROVIDER_SITE_OTHER): Payer: BC Managed Care – PPO | Admitting: Internal Medicine

## 2010-04-05 ENCOUNTER — Encounter (INDEPENDENT_AMBULATORY_CARE_PROVIDER_SITE_OTHER): Payer: Self-pay | Admitting: *Deleted

## 2010-04-05 ENCOUNTER — Ambulatory Visit (INDEPENDENT_AMBULATORY_CARE_PROVIDER_SITE_OTHER)
Admission: RE | Admit: 2010-04-05 | Discharge: 2010-04-05 | Disposition: A | Discharge status: Home / Self Care | Payer: BC Managed Care – PPO | Source: Ambulatory Visit | Attending: Internal Medicine | Admitting: Internal Medicine

## 2010-04-05 ENCOUNTER — Other Ambulatory Visit: Payer: Self-pay | Admitting: Internal Medicine

## 2010-04-05 ENCOUNTER — Encounter: Payer: Self-pay | Admitting: Internal Medicine

## 2010-04-05 DIAGNOSIS — M5412 Radiculopathy, cervical region: Secondary | ICD-10-CM

## 2010-04-08 ENCOUNTER — Telehealth: Payer: Self-pay | Admitting: Internal Medicine

## 2010-04-11 NOTE — Letter (Signed)
Summary: Out of Work  [facility]  [address] [place], [place] [address]   Phone: [phone]  Fax: [phone]    April 05, 2010   Employee:  Miranda Green    To Whom It May Concern:   For Medical reasons, please excuse the above named employee from work for the following dates:  Start: 04/05/10    End: 04/08/10, May return to work on Monday 04/09/10    If you need additional information, please feel free to contact our office.         Sincerely,    Dr. Rene Paci
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
## 2010-04-12 ENCOUNTER — Encounter (INDEPENDENT_AMBULATORY_CARE_PROVIDER_SITE_OTHER): Payer: Self-pay | Admitting: *Deleted

## 2010-04-12 ENCOUNTER — Telehealth: Payer: Self-pay | Admitting: Internal Medicine

## 2010-04-17 NOTE — Progress Notes (Signed)
Summary: neck xray results -  Phone Note Outgoing Call   Summary of Call: pls call pt - xray neck 04/05/10 reviewed - mod arthritis and straightening of spine in neck seen on xray - this is contributing to her pain symptoms - if continued pain or problems despite med tx, pt to call for referral to neck specialist as we discussed at [facility] - thanks Initial call taken by: Newt Lukes MD,  April 08, 2010 11:25 AM     Notified pt with results. Pt states pain is still not better. Requesting md to go ahead and set-up referral. Did let pt know once referral has been set-up will be contacted by [facility] with appt,date and time.Marland KitchenMarland Kitchen2/20/12@12 :47pm  ok - order placed - [facility] to arrange -thanks, Newt Lukes MD  April 09, 2010 1:23 PM

## 2010-04-17 NOTE — Letter (Signed)
Summary: Out of Work  [facility]  [address] [place], [place] [address]   Phone: [phone]  Fax: [phone]    April 12, 2010   Employee:  Miranda Green    To Whom It May Concern:   For Medical reasons, please excuse the above named employee from work for the following dates:  Start:   04/05/2010  End:   04/17/2010  If you need additional information, please feel free to contact our office.         Sincerely,    Margaret Pyle, CMA for Dr Rene Paci

## 2010-04-17 NOTE — Progress Notes (Signed)
Summary: ALT med  Phone Note Call from Patient   Caller: Patient [phone] Summary of Call: Pt called stating that she has appt with NS 02/28 but is in severe pain. Pt says she cannot take Zanaflex while working because it causes sedation. Pt is requesting an alternate medication for pain or and extention of work note until NS appt. Please advise Initial call taken by: Margaret Pyle, CMA,  April 12, 2010 8:26 AM  Follow-up for Phone Call        Since the last saw Dr Felicity Coyer feb 16, I will ask if Dr Felicity Coyer feels comfortable addressing this issue Follow-up by: Corwin Levins MD,  April 12, 2010 12:33 PM  Additional Follow-up for Phone Call Additional follow up Details #1::        ok to extend work note until 2/28 Nsurg appt - also try robaxin in place of znaflex (may be less sedating) - erx done Additional Follow-up by: Newt Lukes MD,  April 12, 2010 12:38 PM    Additional Follow-up for Phone Call Additional follow up Details #2::    Pt advised, work note updated and faxed per pt request to [phone] Follow-up by: Margaret Pyle, CMA,  April 12, 2010 2:45 PM  New/Updated Medications: ROBAXIN 500 MG TABS (METHOCARBAMOL) 1 by mouth every 8 hours as needed for muscle spasm Prescriptions: ROBAXIN 500 MG TABS (METHOCARBAMOL) 1 by mouth every 8 hours as needed for muscle spasm  #30 x 0   Entered and Authorized by:   Newt Lukes MD   Signed by:   Newt Lukes MD on 04/12/2010   Method used:   Electronically to        [facility] [phone]* (retail)       [address]       [place], [place]  [address]       Ph: [phone]       Fax: [phone]   RxID:   [id number]

## 2010-05-07 ENCOUNTER — Other Ambulatory Visit: Payer: Self-pay | Admitting: Neurological Surgery

## 2010-05-07 DIAGNOSIS — M5030 Other cervical disc degeneration, unspecified cervical region: Secondary | ICD-10-CM

## 2010-05-12 ENCOUNTER — Ambulatory Visit
Admission: RE | Admit: 2010-05-12 | Discharge: 2010-05-12 | Disposition: A | Discharge status: Home / Self Care | Payer: BC Managed Care – PPO | Source: Ambulatory Visit | Attending: Neurological Surgery | Admitting: Neurological Surgery

## 2010-05-12 DIAGNOSIS — M5030 Other cervical disc degeneration, unspecified cervical region: Secondary | ICD-10-CM

## 2010-05-31 ENCOUNTER — Telehealth: Payer: Self-pay | Admitting: Internal Medicine

## 2010-05-31 ENCOUNTER — Ambulatory Visit (HOSPITAL_COMMUNITY)
Admission: RE | Admit: 2010-05-31 | Discharge: 2010-05-31 | Disposition: A | Discharge status: Home / Self Care | Payer: Federal, State, Local not specified - PPO | Source: Ambulatory Visit | Attending: Neurological Surgery | Admitting: Neurological Surgery

## 2010-05-31 ENCOUNTER — Other Ambulatory Visit (HOSPITAL_COMMUNITY): Payer: Self-pay | Admitting: Neurological Surgery

## 2010-05-31 ENCOUNTER — Encounter (HOSPITAL_COMMUNITY)
Admission: RE | Admit: 2010-05-31 | Discharge: 2010-05-31 | Disposition: A | Discharge status: Home / Self Care | Payer: Federal, State, Local not specified - PPO | Source: Ambulatory Visit | Attending: Neurological Surgery | Admitting: Neurological Surgery

## 2010-05-31 DIAGNOSIS — M5020 Other cervical disc displacement, unspecified cervical region: Secondary | ICD-10-CM

## 2010-05-31 DIAGNOSIS — Z01812 Encounter for preprocedural laboratory examination: Secondary | ICD-10-CM | POA: Insufficient documentation

## 2010-05-31 DIAGNOSIS — Z01818 Encounter for other preprocedural examination: Secondary | ICD-10-CM | POA: Insufficient documentation

## 2010-05-31 DIAGNOSIS — I10 Essential (primary) hypertension: Secondary | ICD-10-CM | POA: Insufficient documentation

## 2010-05-31 DIAGNOSIS — Z01810 Encounter for preprocedural cardiovascular examination: Secondary | ICD-10-CM | POA: Insufficient documentation

## 2010-05-31 DIAGNOSIS — I4902 Ventricular flutter: Secondary | ICD-10-CM | POA: Insufficient documentation

## 2010-05-31 LAB — CBC
HCT: 41.5 % (ref 36.0–46.0)
Hemoglobin: 14 g/dL (ref 12.0–15.0)
RBC: 4.8 MIL/uL (ref 3.87–5.11)
RDW: 13.4 % (ref 11.5–15.5)
WBC: 5.1 10*3/uL (ref 4.0–10.5)

## 2010-05-31 LAB — BASIC METABOLIC PANEL
GFR calc non Af Amer: >60 mL/min (ref >60)
Glucose, Bld: 100 mg/dL — ABNORMAL HIGH (ref 70–99)
Potassium: 4 mEq/L (ref 3.5–5.1)
Sodium: 138 mEq/L (ref 135–145)

## 2010-05-31 NOTE — Telephone Encounter (Signed)
Faxed OV & EKG to [place] (per Delice Bison) at [facility] ([phone]).

## 2010-06-04 ENCOUNTER — Observation Stay (HOSPITAL_COMMUNITY)
Admission: RE | Admit: 2010-06-04 | Discharge: 2010-06-05 | Disposition: A | Discharge status: Home / Self Care | Payer: Federal, State, Local not specified - PPO | Source: Ambulatory Visit | Attending: Neurological Surgery | Admitting: Neurological Surgery

## 2010-06-04 ENCOUNTER — Ambulatory Visit (HOSPITAL_COMMUNITY): Payer: Federal, State, Local not specified - PPO

## 2010-06-04 DIAGNOSIS — M5000 Cervical disc disorder with myelopathy, unspecified cervical region: Principal | ICD-10-CM | POA: Insufficient documentation

## 2010-06-05 HISTORY — PX: NECK SURGERY: SHX720

## 2010-07-06 NOTE — Op Note (Signed)
NAME:  Miranda Green, CASADOS NO.:  [id number]   MEDICAL RECORD NO.:  [id number]                   PATIENT TYPE:  OBV   LOCATION:  9199                                 FACILITY:  [facility]   PHYSICIAN:  Janine Limbo, M.D.            DATE OF BIRTH:  1960-07-11   DATE OF PROCEDURE:  04/27/2002  DATE OF DISCHARGE:                                 OPERATIVE REPORT   PREOPERATIVE DIAGNOSES:  1. A 12-week sized fibroid uterus.  2. Dysmenorrhea.  3. Menorrhagia.  4. Anemia (hemoglobin is 10.1).  5. Dyspareunia.  6. Pelvic relaxation with a cystocele and rectocele.   POSTOPERATIVE DIAGNOSES:  1. A 12-week sized fibroid uterus.  2. Dysmenorrhea.  3. Menorrhagia.  4. Anemia (hemoglobin is 10.1).  5. Dyspareunia.  6. Pelvic relaxation with a cystocele and rectocele.   PROCEDURE:  1. Vaginal hysterectomy.  2. Anterior and posterior colporrhaphy.   SURGEON:  Janine Limbo, M.D.   FIRST ASSISTANT:  Osborn Coho, M.D.   ANESTHESIA:  General.   DISPOSITION:  The patient is a 50 year old female para 2-1-3-2 who presents  with the above mentioned diagnosis.  She understands the indications for her  surgical procedure and she accepts the risks of, but not limited to,  anesthetic complications, bleeding, infections, and possible damage to the  surrounding organs.   FINDINGS:  The uterus was 12 weeks size and it contained multiple fibroids.  The weight of the uterus was 259 g.  The fallopian tubes and ovaries  appeared normal.  There was a cystocele and rectocele present.  The  patient's tissues were noted to be particularly thin and friable.   PROCEDURE:  The patient was taken to the operating room where a general  anesthetic was given.  The patient's abdomen, perineum, and vagina were  prepped with multiple layers of Betadine.  Examination under anesthesia was  performed.  A Foley catheter was placed in the bladder.  The patient was  sterilely draped.   The cervix was injected with a diluted solution of  Pitressin and saline.  A circumferential incision was made around the cervix  and the vaginal mucosa was advanced anteriorly and posteriorly.  The  posterior cul-de-sac was entered.  Alternating from right to left the  uterosacral ligaments, paracervical tissues, and parametrial tissues were  clamped, cut, sutured, and tied securely.  The anterior cul-de-sac was then  sharply entered.  The uterine arteries were then clamped and cut.  They were  suture ligated.  The uterus was inverted through the posterior colpotomy.  The upper pedicles were clamped and cut and the uterus was removed from the  operative field.  The upper pedicles were secured using first suture  ligatures and then a free tie.  Hemostasis was noted to be adequate.  The  sutures attached to the uterosacral ligaments were brought out through the  vaginal angles and tied securely.  A McCall culdoplasty was  placed in the  posterior cul-de-sac incorporating the uterosacral ligaments bilaterally and  the posterior peritoneum.  A final check was made for hemostasis and  hemostasis was noted to be adequate.  The vaginal cuff was then closed using  figure-of-eight sutures.  0 Vicryl was the suture material used at this  point.  The patient was noted to drain clear yellow urine at the end of our  procedure.  The vaginal mucosa that underlies the bladder was then injected  with a diluted solution of Pitressin and saline.  An incision was made in  the vaginal mucosa and the vaginal mucosa was sharply separated from the  underlying connective tissue.  The endopelvic fascia was then reapproximated  in the midline using interrupted sutures of 0 chromic catgut.  The excess  vaginal mucosa was removed.  The vaginal mucosa was then closed using  interrupted sutures of 0 Vicryl.  We were then ready to begin with the  posterior colporrhaphy.  The perineal body was injected as was the vaginal   mucosa overlying the rectum.  An incision was made in the vaginal mucosa and  the vaginal mucosa was bluntly and sharply separated from the underlying  endopelvic fascia.  The endopelvic fascia was reapproximated in the midline.  2-0 chromic and 0 Vicryl were used for this.  The perineal body was  reinforced using 0 Vicryl.  The excess vaginal mucosa was removed.  The  vaginal mucosa was then closed using a running suture of 0 Vicryl.  Hemostasis was noted to be adequate.  The McCall culdoplasty suture was then  tied securely and the apex of the vagina was noted to elevate into the mid  pelvis.  The vagina was then packed with 2 inch cotton gauze that had been  soaked with estrogen cream.  The patient tolerated her procedure well.  0  Vicryl was the suture material used except where otherwise mentioned in this  report.  Sponge, needle, and instrument counts were correct on two  occasions.  The estimated blood loss was 200 mL.  The patient tolerated her  procedure quite well.  The patient was noted to drain clear yellow urine.  She was awakened from her anesthetic and then taken to the recovery room in  stable condition.                                               Janine Limbo, M.D.    AVS/MEDQ  D:  04/27/2002  T:  04/27/2002  Job:  [id number]

## 2010-07-06 NOTE — Discharge Summary (Signed)
NAME:  Miranda Green, Miranda Green NO.:  [id number]   MEDICAL RECORD NO.:  [id number]                   PATIENT TYPE:   LOCATION:                                       FACILITY:   PHYSICIAN:  Janine Limbo, M.D.            DATE OF BIRTH:   DATE OF ADMISSION:  04/27/2002  DATE OF DISCHARGE:  04/28/2002                                 DISCHARGE SUMMARY   DISCHARGE DIAGNOSES:  1. Fibroid uterus.  2. Dysmenorrhea.  3. Menorrhagia.  4. Anemia.  5. Dyspareunia.  6. Pelvic relaxation with cystocele and rectocele.  7. Possible depression.   PROCEDURES THIS ADMISSION, April 27, 2002:  1. Vaginal hysterectomy.  2. Anterior and posterior colporrhaphy.   HISTORY OF PRESENT ILLNESS:  Ms. Lawniczak is a 50 year old female, para 2-1-3-  2, who presents with the above-mentioned diagnoses. Please see her dictated  history and physical exam for details.   ADMISSION PHYSICAL EXAMINATION:  The uterus was 12-weeks size and irregular.  The patient had a cystocele and rectocele.   HOSPITAL COURSE:  On the day of admission, the patient underwent a vaginal  hysterectomy and anterior and posterior colporrhaphy.  Operative findings  included a 12-week size, multifibroid uterus. The fallopian tubes and the  ovaries appeared normal. The patient had a cystocele and rectocele. The  patient tolerated her procedure well. The estimated blood loss was 200 mL.  The patient's postoperative hemoglobin was 7.9 (preoperative hemoglobin  10.1). Orthostatic blood pressures and pulses were obtained, and the patient  was noted not to be orthostatic. The patient eventually was, however, able  to go home in stable condition on postoperative day #1. She tolerated a  regular diet quickly. She was felt to have received maximal hospital benefit  on April 28, 2002. The patient remained afebrile.   DISCHARGE MEDICATIONS:  1. Vicodin one or two tablets q.4h. p.r.n. pain.  2. Phenergan 25 mg q.6h.  p.r.n. nausea.  3. Iron 325 mg one tablet b.i.d. for six weeks.  4. Prozac 20 mg one tablet daily.  5. Colace (stool softener).   DISCHARGE INSTRUCTIONS:  The patient will return to see Dr. Stefano Gaul in two  weeks and again in six weeks for followup examination.  She was told to  refrain from driving for one to two weeks, heavy lifting for four weeks, and  intercourse for six weeks. She is to call for temperature greater than 101  or for severe problems.   FINAL PATHOLOGY REPORT:  259 gm multifibroid uterus measuring 12 x 9 x 7 cm  in size. No endometriosis or malignancy was identified.  Secretory  endometrium without hyperplasia or malignancy.                                               Merton Border  Zack Seal, M.D.    AVS/MEDQ  D:  01/28/2003  T:  01/29/2003  Job:  [id number]

## 2010-07-06 NOTE — H&P (Signed)
NAME:  Miranda Green, Miranda Green NO.:  [id number]   MEDICAL RECORD NO.:  [id number]                   PATIENT TYPE:   LOCATION:                                       FACILITY:   PHYSICIAN:  Janine Limbo, M.D.            DATE OF BIRTH:   DATE OF ADMISSION:  04/27/2002  DATE OF DISCHARGE:                                HISTORY & PHYSICAL   HISTORY OF PRESENT ILLNESS:  This patient is a 50 year old female, para 2-1-  3-2, who presents for a vaginal hysterectomy with anterior and posterior  colporrhaphy.  The patient complains of dysmenorrhea, menorrhagia,  dyspareunia and pelvic relaxation symptoms.  An ultrasound was performed  that showed an 11.9 x 8.1 cm uterus with multiple fibroids (the largest  fibroid measuring 5.0 x 4.6 cm).  The patient's ovaries appeared normal on  that ultrasound.  An endometrial biopsy showed benign elements.  The  patient's most recent Pap smear was in November 2003 and it was within  normal limits.  Birth control pills and nonsteroidal anti-inflammatory  agents have been unable to relieve her discomfort.  The patient has seen a  urologist and he does not feel that there is any evidence of a neurogenic  bladder.  The patient also complains of dyspareunia to the point that she  has trouble performing her normal functions.   OBSTETRICAL HISTORY:  The patient has had 1 cesarean section.  She has had 1  preterm delivery.  She has had 2 term deliveries.  She has had 2 elective  pregnancy terminations and 1 first trimester miscarriage.   PAST MEDICAL HISTORY:  The patient denies hypertension and diabetes.   DRUG ALLERGIES:  No known drug allergies.   SOCIAL HISTORY:  The patient drinks alcohol socially.  She denies cigarette  use and other recreational drug uses.   REVIEW OF SYSTEMS:  Please see history of present illness.  The patient also  complains of occasional constipation.  She wears glasses.   FAMILY HISTORY:  The  family history is most significant for a sister who has  premenopausal breast cancer.  The patient's mammogram was within normal  limits.  The patient also has a family history of hypertension, diabetes,  and a stroke.   PHYSICAL EXAMINATION:  VITAL SIGNS:  Weight is 164 pounds.  HEENT:  Within normal limits.  CHEST:  Clear.  HEART:  Regular rate and rhythm.  BREASTS:  Without masses.  ABDOMEN:  Nontender.  EXTREMITIES:  Within normal limits.  NEUROLOGIC:  Exam is grossly normal.  PELVIC:  External genitalia are normal.  The vagina is normal except for  relaxation anteriorly and posteriorly.  The cervix is nontender.  The cervix  does descend one-half the length of the vagina.  The uterus is approximately  12 weeks size and irregular.  It is posterior in position.  No adnexal  masses are appreciated.  RECTAL/VAGINAL: Exam  confirms.   ASSESSMENT:  1. A 12-week size fibroid uterus.  2. Menorrhagia.  3. Dysmenorrhea.  4. Dyspareunia.  5. Pelvic relaxation with pelvic pressure symptoms.   PLAN:  The patient will undergo a vaginal hysterectomy with anterior and  posterior colporrhaphy.  The patient understands the indications for her  procedure and she accepts of, but not limited to, anesthetic complications,  bleeding, infections, and the possible damage to the surrounding organs.                                               Janine Limbo, M.D.    AVS/MEDQ  D:  04/26/2002  T:  04/26/2002  Job:  [id number]

## 2010-08-23 NOTE — Op Note (Signed)
NAMETOSHIE, DEMELO NO.:  [id number]  MEDICAL RECORD NO.:  [id number]           PATIENT TYPE:  O  LOCATION:  3524                         FACILITY:  [facility]  PHYSICIAN:  Stefani Dama, M.D.  DATE OF BIRTH:  [birth year]  DATE OF PROCEDURE:  06/04/2010 DATE OF DISCHARGE:                              OPERATIVE REPORT   PREOPERATIVE DIAGNOSIS:  Herniated nucleus pulposus, C6-C7, left cervical radiculopathy.  POSTOPERATIVE DIAGNOSIS:  Herniated nucleus pulposus, C6-C7, left cervical radiculopathy.  PROCEDURE:  Microdiskectomy, C6-C7, left, with operating microscope via METRx endoscopic approach in a prone position.  SURGEON:  Stefani Dama, MD.  FIRST ASSISTANT:  Cristi Loron, MD.  ANESTHESIA:  General endotracheal.  INDICATIONS:  Miranda Green is a 50 year old individual, has had significant shoulder and left arm pain that has been unrelenting for a number of months.  She has a large extruded fragment of disk at C6-C7 on the left side and has weakness in the triceps and grip.  She is advised regarding surgical decompression via posterior diskectomy.  DESCRIPTION OF PROCEDURE:  The patient was brought to the operating room, supine on the stretcher.  Attempts to place a central venous catheter in the holding area were unsuccessful and the initial plan to do a sitting cervical procedure was changed to doing the patient on a prone position without central venous access.  After smooth induction of general endotracheal anesthesia and then placement of a Foley catheter, the patient was placed in the 3-pin headrest and then placed onto the operating table in the prone position.  The head was carefully secured. Bony prominences were carefully padded and the shoulders were taped appropriately.  Lateral C-spine fluoroscopy was obtained after prepping and draping sterilely.  This again was identified by needle placement at C6-C7 under fluoroscopic guidance and then  this area was infiltrated with 10 mL of 0.5% Marcaine, mixed 50:50 with 1:100,000 and 1% lidocaine with epinephrine.  A small vertical incision was then made in this area and a K-wire was passed to laminar arch of C6.  A winding motion was then used to dissect the tissues at C6-C7 in a cephalocaudal and medial lateral direction.  Series of dilators were then passed over this initial probe to dilate to 18-mm diameter with 5 cm deep endoscopic cannula.  This was fixed to the operating table with a clamp.  The soft tissues over the interlaminar space at C6-C7 were then cleared and a laminotomy was created using a high-speed drill and 2-mm dissecting tool.  The outer ligament was cleared over the common dural tube and the takeoff of the C7 nerve root was noted be bulged dorsally quite significantly in the foramen.  The superior articular process for the C7 facet was also removed partially to allow better exposure.  Epidural veins in this area bled profusely and they were controlled with bipolar cautery and some small pledgets of Gelfoam soaked in thrombin.  As the dissection continued, we were able to elevate the nerve root and then mobilized the common dural tube medially.  The dissection was then continued and significant mass came into view.  This mass was incised with #15 blade and under significant pressure some disk material extruded itself through this thin ligament has failed.  The disk fragments were removed in a piecemeal fashion and numerous fragments were encountered.  A ball dissector was then used to release and mobilized from medial to lateral more of disk fragments.  Also, mobilizing from lateral to medial any subligamentous fragments in this area.  This allowed for good decompression of the C7 nerve root. Hemostasis was required to be obtained multiply during this procedure as the epidural veins in this area bled profusely.  Once these were secured with some small pledgets of  Gelfoam and judicious use of the bipolar cautery, we were able to dissect out all the subligamentous herniated disk material.  Once this was accomplished, the common dural tube and C7 nerve root were well decompressed and easily palpable.  Hemostasis was achieved and mostly Gelfoam was irrigated away, some small pledgets were left under the nerve root in the C6-7 space.  At this point with good hemostasis being obtained, the endoscopic cannula was removed.  The tissues were allowed to fall together after a few minutes of hemostasis with bipolar cautery and some 4x4 tamponade.  We were able to close the fascia with a singular 3-0 Vicryl stitch and 3-0 Vicryl was used in the subcuticular tissues.  Dermabond was placed in the skin.  The patient tolerated the procedure well.  Blood loss was estimated at little under 200 mL of blood.     Stefani Dama, M.D.     Merla Riches  D:  06/04/2010  T:  06/04/2010  Job:  [id number]  Electronically Signed by Barnett Abu M.D. on 08/23/2010 04:37:43 PM

## 2011-02-05 ENCOUNTER — Other Ambulatory Visit: Payer: Self-pay | Admitting: Internal Medicine

## 2011-02-05 ENCOUNTER — Encounter: Payer: Self-pay | Admitting: Internal Medicine

## 2011-02-05 DIAGNOSIS — Z1231 Encounter for screening mammogram for malignant neoplasm of breast: Secondary | ICD-10-CM

## 2011-02-05 DIAGNOSIS — Z0000 Encounter for general adult medical examination without abnormal findings: Secondary | ICD-10-CM | POA: Insufficient documentation

## 2011-02-05 DIAGNOSIS — Z0001 Encounter for general adult medical examination with abnormal findings: Secondary | ICD-10-CM | POA: Insufficient documentation

## 2011-02-06 ENCOUNTER — Ambulatory Visit (INDEPENDENT_AMBULATORY_CARE_PROVIDER_SITE_OTHER): Payer: Federal, State, Local not specified - PPO | Admitting: Internal Medicine

## 2011-02-06 ENCOUNTER — Encounter: Payer: Self-pay | Admitting: Internal Medicine

## 2011-02-06 ENCOUNTER — Other Ambulatory Visit: Payer: Federal, State, Local not specified - PPO

## 2011-02-06 VITALS — BP 114/58 | HR 71 | Temp 98.6°F | Ht 65.0 in | Wt 165.4 lb

## 2011-02-06 DIAGNOSIS — F329 Major depressive disorder, single episode, unspecified: Secondary | ICD-10-CM

## 2011-02-06 DIAGNOSIS — R350 Frequency of micturition: Secondary | ICD-10-CM

## 2011-02-06 DIAGNOSIS — Z0000 Encounter for general adult medical examination without abnormal findings: Secondary | ICD-10-CM

## 2011-02-06 LAB — POCT URINALYSIS DIPSTICK
Bilirubin, UA: NEGATIVE
Leukocytes, UA: NEGATIVE
Nitrite, UA: NEGATIVE
Protein, UA: NEGATIVE
pH, UA: 6.5

## 2011-02-06 MED ORDER — CEPHALEXIN 500 MG PO CAPS: 500.0000 mg | ORAL_CAPSULE | Freq: Four times a day (QID) | ORAL | Status: AC

## 2011-02-06 NOTE — Assessment & Plan Note (Signed)
Overall doing well, age appropriate education and counseling updated, referrals for preventative services and immunizations addressed, dietary and smoking counseling addressed, most recent labs and ECG reviewed.  I have personally reviewed and have noted: 1) the patient's medical and social history 2) The pt's use of alcohol, tobacco, and illicit drugs 3) The patient's current medications and supplements 4) Functional ability including ADL's, fall risk, home safety risk, hearing and visual impairment 5) Diet and physical activities 6) Evidence for depression or mood disorder 7) The patient's height, weight, and BMI have been recorded in the chart I have made referrals, and provided counseling and education based on review of the above Declines labs today, but ok for colonoscopy referral - will do

## 2011-02-06 NOTE — Assessment & Plan Note (Signed)
And dysuria, with few incont episode for 1 wk - UA dip neg, but will check urine cx, tx with cephalexin asd

## 2011-02-06 NOTE — Assessment & Plan Note (Signed)
stable overall by hx and exam, most recent data reviewed with pt, and pt to continue medical treatment as before  Lab Results  Component Value Date   WBC 5.1 05/31/2010   HGB 14.0 05/31/2010   HCT 41.5 05/31/2010   PLT 233 05/31/2010   GLUCOSE 100* 05/31/2010   CHOL 194 06/28/2009   TRIG 53.0 06/28/2009   HDL 43.40 06/28/2009   LDLCALC 140* 06/28/2009   ALT 15 06/28/2009   AST 18 06/28/2009   NA 138 05/31/2010   K 4.0 05/31/2010   CL 107 05/31/2010   CREATININE 0.61 05/31/2010   BUN 9 05/31/2010   CO2 23 05/31/2010   TSH 1.12 06/28/2009

## 2011-02-06 NOTE — Patient Instructions (Addendum)
Take all new medications as prescribed You urine will be sent for urine culture Please call the phone number [phone] (the [employer] System) for results of testing in 2-3 days;  When calling, simply dial the number, and when prompted enter the MRN number above (the Medical Record Number) and the # key, then the message should start. You will be contacted regarding the referral for: colonoscopy Please return in 1 year for your yearly visit, or sooner if needed, with Lab testing done 3-5 days before

## 2011-02-06 NOTE — Progress Notes (Signed)
Subjective:    Patient ID: Miranda Green, female    DOB: Dec 30, 1960, 50 y.o.   MRN: [id number]  HPI   Here for wellness and f/u;  Overall doing ok;  Pt denies CP, worsening SOB, DOE, wheezing, orthopnea, PND, worsening LE edema, palpitations, dizziness or syncope.  Pt denies neurological change such as new Headache, facial or extremity weakness.  Pt denies polydipsia, polyuria, or low sugar symptoms. Pt states overall good compliance with treatment and medications, good tolerability, and trying to follow lower cholesterol diet.  Pt denies worsening depressive symptoms, suicidal ideation or panic. No fever, wt loss, night sweats, loss of appetite, or other constitutional symptoms.  Pt states good ability with ADL's, low fall risk, home safety reviewed and adequate, no significant changes in hearing or vision, and occasionally active with exercise.  Also Here to c/o  1 wk onset  Urinary freq  and dysuria with small amoutns of urine, but without hematuria, abd pain, n/v, fever, chills.  Has hx of mlt UTI in the past and trying to get here early with her symptoms.  Pt denies chest pain, increased sob or doe, wheezing, orthopnea, PND, increased LE swelling, palpitations, dizziness or syncope.  Pt denies new neurological symptoms such as new headache, or facial or extremity weakness or numbness   Pt denies polydipsia, polyuria.   Pt denies fever, wt loss, night sweats, loss of appetite, or other constitutional symptoms. Denies worsening depressive symptoms, suicidal ideation, or panic.  No recnet change in night time awakenings or wheezing. Past Medical History  Diagnosis Date  . Anemia   . Hyperlipidemia   . Allergy   . Anxiety   . Asthma   . Depression   . Colonic polyp    Past Surgical History  Procedure Date  . Abdominal hysterectomy     reports that she has never smoked. She does not have any smokeless tobacco history on file. She reports that she drinks alcohol. She reports that she does not  use illicit drugs. family history includes Hypertension in her other. Allergies  Allergen Reactions  . Aspirin     REACTION: stomach upset  . Sertraline Hcl     REACTION: feeling odd   No current outpatient prescriptions on file prior to visit.   Review of Systems Review of Systems  Constitutional: Negative for diaphoresis, activity change, appetite change and unexpected weight change.  HENT: Negative for hearing loss, ear pain, facial swelling, mouth sores and neck stiffness.   Eyes: Negative for pain, redness and visual disturbance.  Respiratory: Negative for shortness of Green and wheezing.   Cardiovascular: Negative for chest pain and palpitations.  Gastrointestinal: Negative for diarrhea, blood in stool, abdominal distention and rectal pain.  Genitourinary: Negative for hematuria, flank pain and decreased urine volume.  Musculoskeletal: Negative for myalgias and joint swelling.  Skin: Negative for color change and wound.  Neurological: Negative for syncope and numbness.  Hematological: Negative for adenopathy.  Psychiatric/Behavioral: Negative for hallucinations, self-injury, decreased concentration and agitation.      Objective:   Physical Exam BP 114/58  Pulse 71  Temp(Src) 98.6 F (37 C) (Oral)  Ht 5\' 5"  (1.651 m)  Wt 165 lb 6.4 oz (75.025 kg)  BMI 27.52 kg/m2  SpO2 98% Physical Exam  VS noted Constitutional: Pt appears well-developed and well-nourished.  HENT: Head: Normocephalic.  Right Ear: External ear normal.  Left Ear: External ear normal.  Eyes: Conjunctivae and EOM are normal. Pupils are equal, round, and reactive to light.  Neck: Normal range of motion. Neck supple.  Cardiovascular: Normal rate and regular rhythm.   Pulmonary/Chest: Effort normal and Green sounds normal.  Abd:  Soft, NT, non-distended, + BS, no flank tender Neurological: Pt is alert. No cranial nerve deficit.  Skin: Skin is warm. No erythema.  Psychiatric: Pt behavior is normal.  Thought content normal. 1+ nervous, not depressed affect    Assessment & Plan:

## 2011-02-08 ENCOUNTER — Telehealth: Payer: Self-pay | Admitting: Internal Medicine

## 2011-02-08 DIAGNOSIS — R350 Frequency of micturition: Secondary | ICD-10-CM

## 2011-02-08 NOTE — Telephone Encounter (Signed)
Miranda Green - this pt is able to comprehend well, my mistake, ok to talk to pt herself

## 2011-03-13 ENCOUNTER — Ambulatory Visit (HOSPITAL_COMMUNITY)
Admission: RE | Admit: 2011-03-13 | Discharge: 2011-03-13 | Disposition: A | Discharge status: Home / Self Care | Payer: Federal, State, Local not specified - PPO | Source: Ambulatory Visit | Attending: Internal Medicine | Admitting: Internal Medicine

## 2011-03-13 DIAGNOSIS — Z1231 Encounter for screening mammogram for malignant neoplasm of breast: Secondary | ICD-10-CM | POA: Insufficient documentation

## 2011-04-08 ENCOUNTER — Encounter: Payer: Self-pay | Admitting: Gastroenterology

## 2011-04-12 ENCOUNTER — Ambulatory Visit: Payer: Federal, State, Local not specified - PPO | Admitting: Internal Medicine

## 2011-04-17 ENCOUNTER — Ambulatory Visit: Payer: Federal, State, Local not specified - PPO | Admitting: Internal Medicine

## 2011-05-06 ENCOUNTER — Ambulatory Visit (AMBULATORY_SURGERY_CENTER): Payer: Federal, State, Local not specified - PPO | Admitting: *Deleted

## 2011-05-06 ENCOUNTER — Encounter: Payer: Self-pay | Admitting: Gastroenterology

## 2011-05-06 VITALS — Ht 65.0 in | Wt 168.0 lb

## 2011-05-06 DIAGNOSIS — Z1211 Encounter for screening for malignant neoplasm of colon: Secondary | ICD-10-CM

## 2011-05-06 MED ORDER — PEG-KCL-NACL-NASULF-NA ASC-C 100 G PO SOLR: ORAL | Status: DC

## 2011-05-14 ENCOUNTER — Telehealth: Payer: Self-pay | Admitting: Gastroenterology

## 2011-05-14 NOTE — Telephone Encounter (Signed)
Called pharmacy back and spoke with [facility].  She wanted to know if she could substitute Moviprep with Golytley  I advised that Moviprep is the recommended prep and the doctors do not want to substitute.  There was also a question regarding the time the patient should start drinking her prep today.  I advised her that she starts today at 5:00 pm (1st dose) and then 4:30 am tomorrow morning (2nd dose).

## 2011-05-15 ENCOUNTER — Ambulatory Visit (AMBULATORY_SURGERY_CENTER): Payer: Federal, State, Local not specified - PPO | Admitting: Gastroenterology

## 2011-05-15 ENCOUNTER — Encounter: Payer: Self-pay | Admitting: Gastroenterology

## 2011-05-15 VITALS — BP 112/69 | HR 80 | Temp 96.0°F | Resp 16 | Ht 65.0 in | Wt 168.0 lb

## 2011-05-15 DIAGNOSIS — Z1211 Encounter for screening for malignant neoplasm of colon: Secondary | ICD-10-CM

## 2011-05-15 MED ORDER — SODIUM CHLORIDE 0.9 % IV SOLN: 500.0000 mL | INTRAVENOUS | Status: DC

## 2011-05-15 NOTE — Progress Notes (Signed)
Patient did not experience any of the following events: a burn prior to discharge; a fall within the facility; wrong site/side/patient/procedure/implant event; or a hospital transfer or hospital admission upon discharge from the facility. (G8907) Patient did not have preoperative order for IV antibiotic SSI prophylaxis. (G8918)  

## 2011-05-15 NOTE — Op Note (Signed)
 [facility] [address] [place], [place]  [address]  COLONOSCOPY PROCEDURE REPORT  PATIENT:  Natalea, Sutliff  MR#:  [id number] BIRTHDATE:  1960-09-20, 50 yrs. old  GENDER:  female ENDOSCOPIST:  Vania Rea. Jarold Motto, MD, [facility] REF. BY:  Oliver Barre, M.D. PROCEDURE DATE:  05/15/2011 PROCEDURE:  Average-risk screening colonoscopy G0121 ASA CLASS:  Class II INDICATIONS:  Routine Risk Screening MEDICATIONS:   propofol (Diprivan) 210 mg IV  DESCRIPTION OF PROCEDURE:   After the risks and benefits and of the procedure were explained, informed consent was obtained. Digital rectal exam was performed and revealed no abnormalities. The [id number] endoscope was introduced through the anus and advanced to the cecum, which was identified by both the appendix and ileocecal valve.  The quality of the prep was excellent, using MoviPrep.  The instrument was then slowly withdrawn as the colon was fully examined. <<PROCEDUREIMAGES>>  FINDINGS:  No polyps or cancers were seen.  This was otherwise a normal examination of the colon.   Retroflexed views in the rectum revealed no abnormalities.    The scope was then withdrawn from the patient and the procedure completed.  COMPLICATIONS:  None ENDOSCOPIC IMPRESSION: 1) No polyps or cancers 2) Otherwise normal examination RECOMMENDATIONS: 1) Continue current colorectal screening recommendations for "routine risk" patients with a repeat colonoscopy in 10 years.  REPEAT EXAM:  No  ______________________________ Vania Rea. Jarold Motto, MD, [facility]  CC:  n. eSIGNED:   Vania Rea. Damarrion Mimbs at 05/15/2011 09:54 AM  Juanita Craver, [id number]

## 2011-05-15 NOTE — Patient Instructions (Signed)
YOU HAD AN ENDOSCOPIC PROCEDURE TODAY AT THE [facility]: Refer to the procedure report that was given to you for any specific questions about what was found during the examination.  If the procedure report does not answer your questions, please call your gastroenterologist to clarify.  If you requested that your care partner not be given the details of your procedure findings, then the procedure report has been included in a sealed envelope for you to review at your convenience later.  YOU SHOULD EXPECT: Some feelings of bloating in the abdomen. Passage of more gas than usual.  Walking can help get rid of the air that was put into your GI tract during the procedure and reduce the bloating. If you had a lower endoscopy (such as a colonoscopy or flexible sigmoidoscopy) you may notice spotting of blood in your stool or on the toilet paper. If you underwent a bowel prep for your procedure, then you may not have a normal bowel movement for a few days.  DIET: Your first meal following the procedure should be a light meal and then it is ok to progress to your normal diet.  A half-sandwich or bowl of soup is an example of a good first meal.  Heavy or fried foods are harder to digest and may make you feel nauseous or bloated.  Likewise meals heavy in dairy and vegetables can cause extra gas to form and this can also increase the bloating.  Drink plenty of fluids but you should avoid alcoholic beverages for 24 hours.  ACTIVITY: Your care partner should take you home directly after the procedure.  You should plan to take it easy, moving slowly for the rest of the day.  You can resume normal activity the day after the procedure however you should NOT DRIVE or use heavy machinery for 24 hours (because of the sedation medicines used during the test).    SYMPTOMS TO REPORT IMMEDIATELY: A gastroenterologist can be reached at any hour.  During normal business hours, 8:30 AM to 5:00 PM Monday through Friday,  call [phone].  After hours and on weekends, please call the GI answering service at [phone] who will take a message and have the physician on call contact you.   Following lower endoscopy (colonoscopy or flexible sigmoidoscopy):  Excessive amounts of blood in the stool  Significant tenderness or worsening of abdominal pains  Swelling of the abdomen that is new, acute  Fever of 100F or higher  Following upper endoscopy (EGD)  Vomiting of blood or coffee ground material  New chest pain or pain under the shoulder blades  Painful or persistently difficult swallowing  New shortness of breath  Fever of 100F or higher  Black, tarry-looking stools  FOLLOW UP: If any biopsies were taken you will be contacted by phone or by letter within the next 1-3 weeks.  Call your gastroenterologist if you have not heard about the biopsies in 3 weeks.  Our staff will call the home number listed on your records the next business day following your procedure to check on you and address any questions or concerns that you may have at that time regarding the information given to you following your procedure. This is a courtesy call and so if there is no answer at the home number and we have not heard from you through the emergency physician on call, we will assume that you have returned to your regular daily activities without incident.  SIGNATURES/CONFIDENTIALITY: You and/or your care   partner have signed paperwork which will be entered into your electronic medical record.  These signatures attest to the fact that that the information above on your After Visit Summary has been reviewed and is understood.  Full responsibility of the confidentiality of this discharge information lies with you and/or your care-partner.  

## 2011-05-16 ENCOUNTER — Telehealth: Payer: Self-pay | Admitting: *Deleted

## 2011-05-16 NOTE — Telephone Encounter (Signed)
  Follow up Call-  Call back number 05/15/2011  Post procedure Call Back phone  # [phone]  Permission to leave phone message Yes     Patient questions:  Do you have a fever, pain , or abdominal swelling? yes Pain Score  2 *  Have you tolerated food without any problems? yes  Have you been able to return to your normal activities? yes  Do you have any questions about your discharge instructions: Diet   no Medications  no Follow up visit  no  Do you have questions or concerns about your Care? no  Actions: * If pain score is 4 or above: No action needed, pain <4.  HUSBAND STATING PATIENT HAVING A LITTLE PAIN, DENIES GREATER THAN 4/10. INFORMED TO CALL us WITH PAIN GREATER THAN 4, SWELLING OR FEVER.

## 2011-08-12 ENCOUNTER — Ambulatory Visit: Payer: Federal, State, Local not specified - PPO | Admitting: Internal Medicine

## 2011-08-12 ENCOUNTER — Ambulatory Visit (INDEPENDENT_AMBULATORY_CARE_PROVIDER_SITE_OTHER): Payer: Federal, State, Local not specified - PPO | Admitting: Internal Medicine

## 2011-08-12 ENCOUNTER — Encounter: Payer: Self-pay | Admitting: Internal Medicine

## 2011-08-12 VITALS — BP 110/70 | HR 73 | Temp 98.1°F | Ht 65.0 in | Wt 169.2 lb

## 2011-08-12 DIAGNOSIS — R21 Rash and other nonspecific skin eruption: Secondary | ICD-10-CM

## 2011-08-12 MED ORDER — FLUOCINONIDE 0.1 % EX CREA: 1.0000 "application " | TOPICAL_CREAM | Freq: Three times a day (TID) | CUTANEOUS | Status: DC | PRN

## 2011-08-12 MED ORDER — HYDROXYZINE HCL 25 MG PO TABS: 25.0000 mg | ORAL_TABLET | Freq: Three times a day (TID) | ORAL | Status: AC | PRN

## 2011-08-12 NOTE — Progress Notes (Signed)
Subjective:    Patient ID: Miranda Green, female    DOB: 1960/02/20, 51 y.o.   MRN: [id number]  HPI  Here with 2 wks onset mod to now severe eczematous type rash to both sides of the neck and post upper arms, makes her itch near constantly, disrupting sleep, and nothing OTC seems to help with itching, including benadryl.   Pt denies fever, wt loss, night sweats, loss of appetite, or other constitutional symptoms  Pt denies chest pain, increased sob or doe, wheezing, orthopnea, PND, increased LE swelling, palpitations, dizziness or syncope. No tongue or lip swelling.  Pt denies polydipsia, polyuria, but does not want oral or IM steroid tx due to risk of wt gain.  Pt denies new neurological symptoms such as new headache, or facial or extremity weakness or numbness.  Overall allergy nasal symptoms very mild at best.  Denies worsening depressive symptoms, suicidal ideation, or panic, though has ongoing anxiety, not increased recently. Past Medical History  Diagnosis Date  . Hyperlipidemia   . Allergy   . ALLERGIC RHINITIS 10/13/2006  . ANXIETY 10/13/2006  . ASTHMA 10/13/2006  . DEPRESSION 10/13/2006    resolved  . HYPERLIPIDEMIA 10/13/2006    resolved  . ANEMIA-IRON DEFICIENCY 10/13/2006    resolved after had hysterectomy  . Bleeds easily     Has had problems when has surgery and birthing  . Hernia of abdominal wall    Past Surgical History  Procedure Date  . Abdominal hysterectomy   . Shoulder surgery 06/05/2010    release pressure on nerve  . Cesarean section 1993    reports that she has never smoked. She has never used smokeless tobacco. She reports that she drinks about .6 ounces of alcohol per week. She reports that she does not use illicit drugs. family history includes Colon polyps in her sister and Hypertension in her other.  There is no history of Colon cancer, and Esophageal cancer, and Stomach cancer, and Rectal cancer, . Allergies  Allergen Reactions  . Aspirin     REACTION:  stomach upset   Current Outpatient Prescriptions on File Prior to Visit  Medication Sig Dispense Refill  . b complex vitamins tablet Take 1 tablet by mouth daily.          Review of Systems Review of Systems  Constitutional: Negative for diaphoresis and unexpected weight change.  HENT: Negative for drooling and tinnitus.   Eyes: Negative for photophobia and visual disturbance.  Respiratory: Negative for choking and stridor.   Gastrointestinal: Negative for vomiting and blood in stool.  Genitourinary: Negative for hematuria and decreased urine volume.  Musculoskeletal: Negative for gait problem.  Skin: Negative for color change and wound.  Neurological: Negative for tremors and numbness.  Psychiatric/Behavioral: Negative for decreased concentration. The patient is not hyperactive.      Objective:   Physical Exam BP 110/70  Pulse 73  Temp 98.1 F (36.7 C) (Oral)  Ht 5\' 5"  (1.651 m)  Wt 169 lb 4 oz (76.771 kg)  BMI 28.16 kg/m2  SpO2 98% Physical Exam  VS noted Constitutional: Pt appears well-developed and well-nourished.  HENT: Head: Normocephalic.  Right Ear: External ear normal.  Left Ear: External ear normal.  Eyes: Conjunctivae and EOM are normal. Pupils are equal, round, and reactive to light.  Neck: Normal range of motion. Neck supple.  Cardiovascular: Normal rate and regular rhythm.   Pulmonary/Chest: Effort normal and Green sounds normal.  Neurological: Pt is alert. Not confused Skin: Skin is warm.  Has marked eczematous changes to bilat post neck and post arms, worse from scratching Psychiatric: Pt behavior is normal. Thought content normal.     Assessment & Plan:

## 2011-08-12 NOTE — Patient Instructions (Addendum)
Take all new medications as prescribed Continue all other medications as before  

## 2011-08-13 ENCOUNTER — Telehealth: Payer: Self-pay | Admitting: Internal Medicine

## 2011-08-13 MED ORDER — FLUOCINONIDE 0.05 % EX CREA: TOPICAL_CREAM | Freq: Two times a day (BID) | CUTANEOUS | Status: AC

## 2011-08-13 NOTE — Telephone Encounter (Signed)
Caller: Jakera/Mother; PCP: Oliver Barre; CB#: [phone];  Call regarding Patient Has Severe Excema and Nothing Is Helping; Seen 08/12/11. Previously left message but someone has called back. RX cream ordered yesterday required clarification; has now been received.  Has oral medication to help severe itching. Declined triage. Duplicate contact d/t caller already spoke with someone in office and has no further questions per No Contact Call Guideline.

## 2011-08-18 ENCOUNTER — Encounter: Payer: Self-pay | Admitting: Internal Medicine

## 2011-08-18 DIAGNOSIS — R21 Rash and other nonspecific skin eruption: Secondary | ICD-10-CM | POA: Insufficient documentation

## 2011-08-18 NOTE — Assessment & Plan Note (Addendum)
Allergic/inflammatory related - for lidex asd, and atarax prn itching,  to f/u any worsening symptoms or concerns

## 2012-03-24 ENCOUNTER — Other Ambulatory Visit: Payer: Self-pay | Admitting: Internal Medicine

## 2012-03-24 DIAGNOSIS — Z1231 Encounter for screening mammogram for malignant neoplasm of breast: Secondary | ICD-10-CM

## 2012-04-08 ENCOUNTER — Ambulatory Visit (HOSPITAL_COMMUNITY)
Admission: RE | Admit: 2012-04-08 | Discharge: 2012-04-08 | Disposition: A | Discharge status: Home / Self Care | Payer: Federal, State, Local not specified - PPO | Source: Ambulatory Visit | Attending: Internal Medicine | Admitting: Internal Medicine

## 2012-04-08 DIAGNOSIS — Z1231 Encounter for screening mammogram for malignant neoplasm of breast: Secondary | ICD-10-CM | POA: Insufficient documentation

## 2012-04-09 ENCOUNTER — Other Ambulatory Visit: Payer: Self-pay | Admitting: Internal Medicine

## 2012-04-09 DIAGNOSIS — R928 Other abnormal and inconclusive findings on diagnostic imaging of breast: Secondary | ICD-10-CM

## 2012-04-21 ENCOUNTER — Other Ambulatory Visit: Payer: Federal, State, Local not specified - PPO

## 2012-04-22 ENCOUNTER — Ambulatory Visit
Admission: RE | Admit: 2012-04-22 | Discharge: 2012-04-22 | Disposition: A | Discharge status: Home / Self Care | Payer: Federal, State, Local not specified - PPO | Source: Ambulatory Visit | Attending: Internal Medicine | Admitting: Internal Medicine

## 2012-04-22 DIAGNOSIS — R928 Other abnormal and inconclusive findings on diagnostic imaging of breast: Secondary | ICD-10-CM

## 2012-07-21 ENCOUNTER — Encounter (INDEPENDENT_AMBULATORY_CARE_PROVIDER_SITE_OTHER): Payer: Federal, State, Local not specified - PPO

## 2012-07-21 ENCOUNTER — Ambulatory Visit (INDEPENDENT_AMBULATORY_CARE_PROVIDER_SITE_OTHER): Payer: Federal, State, Local not specified - PPO | Admitting: Internal Medicine

## 2012-07-21 ENCOUNTER — Encounter: Payer: Self-pay | Admitting: Internal Medicine

## 2012-07-21 VITALS — BP 110/84 | HR 63 | Temp 98.5°F | Ht 65.0 in | Wt 169.0 lb

## 2012-07-21 DIAGNOSIS — M7989 Other specified soft tissue disorders: Secondary | ICD-10-CM

## 2012-07-21 DIAGNOSIS — Z0000 Encounter for general adult medical examination without abnormal findings: Secondary | ICD-10-CM

## 2012-07-21 DIAGNOSIS — M79602 Pain in left arm: Secondary | ICD-10-CM | POA: Insufficient documentation

## 2012-07-21 DIAGNOSIS — M79609 Pain in unspecified limb: Secondary | ICD-10-CM

## 2012-07-21 DIAGNOSIS — F329 Major depressive disorder, single episode, unspecified: Secondary | ICD-10-CM

## 2012-07-21 DIAGNOSIS — J309 Allergic rhinitis, unspecified: Secondary | ICD-10-CM

## 2012-07-21 DIAGNOSIS — F3289 Other specified depressive episodes: Secondary | ICD-10-CM

## 2012-07-21 MED ORDER — NAPROXEN 500 MG PO TABS: 500.0000 mg | ORAL_TABLET | Freq: Two times a day (BID) | ORAL | Status: DC

## 2012-07-21 NOTE — Patient Instructions (Addendum)
Please take all new medication as prescribed Please continue all other medications as before, and refills have been done if requested. You will be contacted regarding the referral for: Left arm venous doppler - to see [facility] now If no blood clot, then most likely you have bicep tendonitis, which should improve with the medication in a few wks Please call for referral to orthopedic if you are not improved in 1-2 wks  Please remember to sign up for My Chart if you have not done so, as this will be important to you in the future with finding out test results, communicating by private email, and scheduling acute appointments online when needed.  Please return in 2 weeks, or sooner if needed, with Lab testing done 3-5 days before

## 2012-07-21 NOTE — Assessment & Plan Note (Signed)
stable overall by history and exam, recent data reviewed with pt, and pt to continue medical treatment as before,  to f/u any worsening symptoms or concerns Lab Results  Component Value Date   WBC 5.1 05/31/2010   HGB 14.0 05/31/2010   HCT 41.5 05/31/2010   PLT 233 05/31/2010   GLUCOSE 100* 05/31/2010   CHOL 194 06/28/2009   TRIG 53.0 06/28/2009   HDL 43.40 06/28/2009   LDLCALC 140* 06/28/2009   ALT 15 06/28/2009   AST 18 06/28/2009   NA 138 05/31/2010   K 4.0 05/31/2010   CL 107 05/31/2010   CREATININE 0.61 05/31/2010   BUN 9 05/31/2010   CO2 23 05/31/2010   TSH 1.12 06/28/2009

## 2012-07-21 NOTE — Progress Notes (Signed)
Subjective:    Patient ID: Miranda Green, female    DOB: 19-May-1960, 52 y.o.   MRN: [id number]  HPI Here wit 2-3 days onset left antecubital swelling, tender with ? Venous cord, some distal bicep swelling/tender but none to tricep or deltoid, elbow or distal extremity.  Has feelling of coolness to LUE but no skin changes such as ulcers or blue.  Pt denies chest pain, increased sob or doe, wheezing, orthopnea, PND, increased LE swelling, palpitations, dizziness or syncope. Pt denies new neurological symptoms such as new headache, or facial or extremity weakness or numbness. Denies worsening depressive symptoms, suicidal ideation, or panic Does have several wks ongoing nasal allergy symptoms with clearish congestion, itch and sneezing, without fever, pain, ST, cough, swelling or wheezing.  Past Medical History  Diagnosis Date  . Hyperlipidemia   . Allergy   . ALLERGIC RHINITIS 10/13/2006  . ANXIETY 10/13/2006  . ASTHMA 10/13/2006  . DEPRESSION 10/13/2006    resolved  . HYPERLIPIDEMIA 10/13/2006    resolved  . ANEMIA-IRON DEFICIENCY 10/13/2006    resolved after had hysterectomy  . Bleeds easily     Has had problems when has surgery and birthing  . Hernia of abdominal wall    Past Surgical History  Procedure Laterality Date  . Abdominal hysterectomy    . Shoulder surgery  06/05/2010    release pressure on nerve  . Cesarean section  1993    reports that she has never smoked. She has never used smokeless tobacco. She reports that she drinks about 0.6 ounces of alcohol per week. She reports that she does not use illicit drugs. family history includes Colon polyps in her sister and Hypertension in her other.  There is no history of Colon cancer, and Esophageal cancer, and Stomach cancer, and Rectal cancer, . Allergies  Allergen Reactions  . Aspirin     REACTION: stomach upset   Current Outpatient Prescriptions on File Prior to Visit  Medication Sig Dispense Refill  . b complex vitamins  tablet Take 1 tablet by mouth daily.        . fluocinonide cream (LIDEX) 0.05 % Apply topically 2 (two) times daily.  30 g  0   No current facility-administered medications on file prior to visit.   Review of Systems  Constitutional: Negative for unexpected weight change, or unusual diaphoresis  HENT: Negative for tinnitus.   Eyes: Negative for photophobia and visual disturbance.  Respiratory: Negative for choking and stridor.   Gastrointestinal: Negative for vomiting and blood in stool.  Genitourinary: Negative for hematuria and decreased urine volume.  Musculoskeletal: Negative for acute joint swelling Skin: Negative for color change and wound.  Neurological: Negative for tremors and numbness other than noted  Psychiatric/Behavioral: Negative for decreased concentration or  hyperactivity.       Objective:   Physical Exam BP 110/84  Pulse 63  Temp(Src) 98.5 F (36.9 C) (Oral)  Ht 5\' 5"  (1.651 m)  Wt 169 lb (76.658 kg)  BMI 28.12 kg/m2  SpO2 99% VS noted,  Constitutional: Pt appears well-developed and well-nourished.  HENT: Head: NCAT.  Right Ear: External ear normal.  Left Ear: External ear normal.  Bilat tm's with mild erythema.  Max sinus areas mild tender.  Pharynx with mild erythema, no exudate Eyes: Conjunctivae and EOM are normal. Pupils are equal, round, and reactive to light.  Neck: Normal range of motion. Neck supple.  Cardiovascular: Normal rate and regular rhythm.   Pulmonary/Chest: Effort normal and Green sounds normal.  Left arm, with tender antecub ? Firm vein, with tender/swelling left distal lateral bicep area about 2-3 cm, distal arm without swelling, tender or color change, cappllary reflex normal Neurological: Pt is alert. Not confused  Skin: Skin is warm. No erythema.  Psychiatric: Pt behavior is normal. Thought content normal. not depressed affect    Assessment & Plan:

## 2012-07-21 NOTE — Assessment & Plan Note (Signed)
Cant r/o DVT, for LUE venous doppler but suspect prob bicipital tendonitis, etiology unclear, for nsaid prn,  to f/u any worsening symptoms or concerns

## 2012-07-21 NOTE — Assessment & Plan Note (Signed)
Ok for allegra otc prn,  to f/u any worsening symptoms or concerns 

## 2012-08-20 ENCOUNTER — Other Ambulatory Visit: Payer: Self-pay

## 2012-08-20 MED ORDER — NAPROXEN 500 MG PO TABS: 500.0000 mg | ORAL_TABLET | Freq: Two times a day (BID) | ORAL | Status: DC

## 2012-10-22 ENCOUNTER — Telehealth: Payer: Self-pay | Admitting: Internal Medicine

## 2012-10-22 NOTE — Telephone Encounter (Signed)
Pt wants a referral to dermatology for exema and bumps or blisters that are painful about a month ago.  She can self refer, but wants to know who we would send to.

## 2012-10-22 NOTE — Telephone Encounter (Signed)
Patient informed. 

## 2012-10-22 NOTE — Telephone Encounter (Signed)
Dr hall would be fine

## 2013-04-16 ENCOUNTER — Other Ambulatory Visit: Payer: Self-pay | Admitting: Internal Medicine

## 2013-04-16 DIAGNOSIS — Z1231 Encounter for screening mammogram for malignant neoplasm of breast: Secondary | ICD-10-CM

## 2013-04-21 ENCOUNTER — Ambulatory Visit (HOSPITAL_COMMUNITY): Payer: Federal, State, Local not specified - PPO

## 2013-04-28 ENCOUNTER — Ambulatory Visit (HOSPITAL_COMMUNITY)
Admission: RE | Admit: 2013-04-28 | Discharge: 2013-04-28 | Disposition: A | Discharge status: Home / Self Care | Payer: Federal, State, Local not specified - PPO | Source: Ambulatory Visit | Attending: Internal Medicine | Admitting: Internal Medicine

## 2013-04-28 DIAGNOSIS — Z1231 Encounter for screening mammogram for malignant neoplasm of breast: Secondary | ICD-10-CM | POA: Insufficient documentation

## 2013-06-22 ENCOUNTER — Ambulatory Visit: Payer: Federal, State, Local not specified - PPO | Admitting: Internal Medicine

## 2013-06-24 ENCOUNTER — Encounter: Payer: Self-pay | Admitting: Internal Medicine

## 2013-06-24 ENCOUNTER — Ambulatory Visit (INDEPENDENT_AMBULATORY_CARE_PROVIDER_SITE_OTHER): Payer: Federal, State, Local not specified - PPO | Admitting: Internal Medicine

## 2013-06-24 VITALS — BP 120/80 | HR 70 | Temp 98.6°F | Ht 65.0 in | Wt 176.5 lb

## 2013-06-24 DIAGNOSIS — J309 Allergic rhinitis, unspecified: Secondary | ICD-10-CM

## 2013-06-24 DIAGNOSIS — E669 Obesity, unspecified: Secondary | ICD-10-CM

## 2013-06-24 DIAGNOSIS — Z0000 Encounter for general adult medical examination without abnormal findings: Secondary | ICD-10-CM

## 2013-06-24 DIAGNOSIS — F329 Major depressive disorder, single episode, unspecified: Secondary | ICD-10-CM

## 2013-06-24 DIAGNOSIS — F3289 Other specified depressive episodes: Secondary | ICD-10-CM

## 2013-06-24 DIAGNOSIS — G43909 Migraine, unspecified, not intractable, without status migrainosus: Secondary | ICD-10-CM

## 2013-06-24 MED ORDER — PHENTERMINE HCL 37.5 MG PO CAPS: 37.5000 mg | ORAL_CAPSULE | ORAL | Status: DC

## 2013-06-24 MED ORDER — SUMATRIPTAN SUCCINATE 100 MG PO TABS: 100.0000 mg | ORAL_TABLET | ORAL | Status: DC | PRN

## 2013-06-24 NOTE — Patient Instructions (Addendum)
Please take all new medication as prescribed  Please continue all other medications as before, and refills have been done if requested. Please have the pharmacy call with any other refills you may need.  Please continue your efforts at being more active, low cholesterol diet, and weight control.  Please return in 3 months, or sooner if needed, with Lab testing done 3-5 days before

## 2013-06-24 NOTE — Progress Notes (Signed)
Pre visit review using our clinic review tool, if applicable. No additional management support is needed unless otherwise documented below in the visit note. 

## 2013-06-24 NOTE — Progress Notes (Signed)
Subjective:    Patient ID: Miranda Green, female    DOB: 11/25/1960, 53 y.o.   MRN: [id number]  HPI  Here with c/o recurring HA, throbbing, one sided, with photophobia, nausea, mild to occas severe, about 1-2 times per wk, Does have several wks ongoing nasal allergy symptoms with clearish congestion, itch and sneezing, without fever, pain, ST, cough, swelling or wheezing.  Hard to lose wt as well despite increased exercise and better diet recent. Pt denies chest pain, increased sob or doe, wheezing, orthopnea, PND, increased LE swelling, palpitations, dizziness or syncope.  Pt denies new neurological symptoms such as new headache, or facial or extremity weakness or numbness  . Denies worsening depressive symptoms, suicidal ideation, or panic; Past Medical History  Diagnosis Date  . Hyperlipidemia   . Allergy   . ALLERGIC RHINITIS 10/13/2006  . ANXIETY 10/13/2006  . ASTHMA 10/13/2006  . DEPRESSION 10/13/2006    resolved  . HYPERLIPIDEMIA 10/13/2006    resolved  . ANEMIA-IRON DEFICIENCY 10/13/2006    resolved after had hysterectomy  . Bleeds easily     Has had problems when has surgery and birthing  . Hernia of abdominal wall    Past Surgical History  Procedure Laterality Date  . Abdominal hysterectomy    . Shoulder surgery  06/05/2010    release pressure on nerve  . Cesarean section  1993    reports that she has never smoked. She has never used smokeless tobacco. She reports that she drinks about .6 ounces of alcohol per week. She reports that she does not use illicit drugs. family history includes Colon polyps in her sister; Hypertension in her other. There is no history of Colon cancer, Esophageal cancer, Stomach cancer, or Rectal cancer. Allergies  Allergen Reactions  . Aspirin     REACTION: stomach upset   Current Outpatient Prescriptions on File Prior to Visit  Medication Sig Dispense Refill  . b complex vitamins tablet Take 1 tablet by mouth daily.        . naproxen  (NAPROSYN) 500 MG tablet Take 1 tablet (500 mg total) by mouth 2 (two) times daily with a meal.  180 tablet  3   No current facility-administered medications on file prior to visit.   Review of Systems  Constitutional: Negative for unusual diaphoresis or other sweats  HENT: Negative for ringing in ear Eyes: Negative for double vision or worsening visual disturbance.  Respiratory: Negative for choking and stridor.   Gastrointestinal: Negative for vomiting or other signifcant bowel change Genitourinary: Negative for hematuria or decreased urine volume.  Musculoskeletal: Negative for other MSK pain or swelling Skin: Negative for color change and worsening wound.  Neurological: Negative for tremors and numbness other than noted  Psychiatric/Behavioral: Negative for decreased concentration or agitation other than above       Objective:   Physical Exam BP 120/80  Pulse 70  Temp(Src) 98.6 F (37 C) (Oral)  Ht 5\' 5"  (1.651 m)  Wt 176 lb 8 oz (80.06 kg)  BMI 29.37 kg/m2  SpO2 98% VS noted,  Constitutional: Pt appears well-developed, well-nourished.  HENT: Head: NCAT.  Right Ear: External ear normal.  Left Ear: External ear normal.  Bilat tm's with mild erythema.  Max sinus areas non tender.  Pharynx with mild erythema, no exudate Eyes: . Pupils are equal, round, and reactive to light. Conjunctivae and EOM are normal Neck: Normal range of motion. Neck supple.  Cardiovascular: Normal rate and regular rhythm.   Pulmonary/Chest: Effort  normal and breath sounds normal.  Abd:  Soft, NT, ND, + BS Neurological: Pt is alert. Not confused , motor grossly intact Skin: Skin is warm. No rash Psychiatric: Pt behavior is normal. No agitation.     Assessment & Plan:

## 2013-06-26 DIAGNOSIS — G43909 Migraine, unspecified, not intractable, without status migrainosus: Secondary | ICD-10-CM | POA: Insufficient documentation

## 2013-06-26 NOTE — Assessment & Plan Note (Signed)
For otc allegra prn,  to f/u any worsening symptoms or concerns  

## 2013-06-26 NOTE — Assessment & Plan Note (Signed)
For phentermine asd, to f/u any worsening symptoms or concerns 

## 2013-06-26 NOTE — Assessment & Plan Note (Signed)
For imitrex prn,  to f/u any worsening symptoms or concerns  

## 2013-06-26 NOTE — Assessment & Plan Note (Signed)
stable overall by history and exam, and pt to continue medical treatment as before,  to f/u any worsening symptoms or concerns 

## 2013-09-23 ENCOUNTER — Ambulatory Visit: Payer: Federal, State, Local not specified - PPO | Admitting: Internal Medicine

## 2013-09-23 DIAGNOSIS — Z0289 Encounter for other administrative examinations: Secondary | ICD-10-CM

## 2013-11-17 ENCOUNTER — Ambulatory Visit: Payer: Federal, State, Local not specified - PPO | Admitting: Internal Medicine

## 2014-04-18 ENCOUNTER — Other Ambulatory Visit: Payer: Self-pay | Admitting: Internal Medicine

## 2014-04-18 DIAGNOSIS — Z1231 Encounter for screening mammogram for malignant neoplasm of breast: Secondary | ICD-10-CM

## 2014-05-04 ENCOUNTER — Ambulatory Visit (HOSPITAL_COMMUNITY)
Admission: RE | Admit: 2014-05-04 | Discharge: 2014-05-04 | Disposition: A | Discharge status: Home / Self Care | Payer: Federal, State, Local not specified - PPO | Source: Ambulatory Visit | Attending: Internal Medicine | Admitting: Internal Medicine

## 2014-05-04 DIAGNOSIS — Z1231 Encounter for screening mammogram for malignant neoplasm of breast: Secondary | ICD-10-CM | POA: Diagnosis not present

## 2014-05-05 ENCOUNTER — Other Ambulatory Visit: Payer: Self-pay | Admitting: Internal Medicine

## 2014-05-05 DIAGNOSIS — R928 Other abnormal and inconclusive findings on diagnostic imaging of breast: Secondary | ICD-10-CM

## 2014-05-06 ENCOUNTER — Ambulatory Visit
Admission: RE | Admit: 2014-05-06 | Discharge: 2014-05-06 | Disposition: A | Discharge status: Home / Self Care | Payer: Federal, State, Local not specified - PPO | Source: Ambulatory Visit | Attending: Internal Medicine | Admitting: Internal Medicine

## 2014-05-06 DIAGNOSIS — R928 Other abnormal and inconclusive findings on diagnostic imaging of breast: Secondary | ICD-10-CM

## 2014-05-11 ENCOUNTER — Other Ambulatory Visit: Payer: Federal, State, Local not specified - PPO

## 2014-10-07 ENCOUNTER — Telehealth: Payer: Self-pay | Admitting: Internal Medicine

## 2014-10-07 ENCOUNTER — Encounter: Payer: Self-pay | Admitting: Internal Medicine

## 2014-10-07 ENCOUNTER — Ambulatory Visit (INDEPENDENT_AMBULATORY_CARE_PROVIDER_SITE_OTHER): Payer: Federal, State, Local not specified - PPO | Admitting: Internal Medicine

## 2014-10-07 ENCOUNTER — Other Ambulatory Visit (INDEPENDENT_AMBULATORY_CARE_PROVIDER_SITE_OTHER): Payer: Federal, State, Local not specified - PPO

## 2014-10-07 VITALS — BP 108/76 | HR 75 | Temp 98.6°F | Ht 65.0 in | Wt 176.0 lb

## 2014-10-07 DIAGNOSIS — Z0000 Encounter for general adult medical examination without abnormal findings: Secondary | ICD-10-CM | POA: Diagnosis not present

## 2014-10-07 DIAGNOSIS — R079 Chest pain, unspecified: Secondary | ICD-10-CM | POA: Diagnosis not present

## 2014-10-07 LAB — URINALYSIS, ROUTINE W REFLEX MICROSCOPIC
Bilirubin Urine: NEGATIVE
Hgb urine dipstick: NEGATIVE
Ketones, ur: NEGATIVE
Leukocytes, UA: NEGATIVE
Nitrite: NEGATIVE
Specific Gravity, Urine: 1.015 (ref 1.000–1.030)
Total Protein, Urine: NEGATIVE
URINE GLUCOSE: NEGATIVE
Urobilinogen, UA: 0.2 (ref 0.0–1.0)
WBC UA: NONE SEEN (ref 0–?)
pH: 6.5 (ref 5.0–8.0)

## 2014-10-07 LAB — BASIC METABOLIC PANEL
BUN: 11 mg/dL (ref 6–23)
CALCIUM: 9.3 mg/dL (ref 8.4–10.5)
CO2: 28 meq/L (ref 19–32)
Chloride: 104 mEq/L (ref 96–112)
Creatinine, Ser: 0.79 mg/dL (ref 0.40–1.20)
GFR: 97.27 mL/min (ref >60.00)
GLUCOSE: 82 mg/dL (ref 70–99)
Potassium: 3.5 mEq/L (ref 3.5–5.1)
Sodium: 139 mEq/L (ref 135–145)

## 2014-10-07 LAB — CBC WITH DIFFERENTIAL/PLATELET
BASOS PCT: 0.6 % (ref 0.0–3.0)
Basophils Absolute: 0 10*3/uL (ref 0.0–0.1)
EOS ABS: 0.2 10*3/uL (ref 0.0–0.7)
Eosinophils Relative: 3 % (ref 0.0–5.0)
HEMATOCRIT: 37.3 % (ref 36.0–46.0)
HEMOGLOBIN: 12.5 g/dL (ref 12.0–15.0)
Lymphocytes Relative: 23.1 % (ref 12.0–46.0)
Lymphs Abs: 1.4 10*3/uL (ref 0.7–4.0)
MCHC: 33.6 g/dL (ref 30.0–36.0)
MCV: 85.8 fl (ref 78.0–100.0)
Monocytes Absolute: 0.4 10*3/uL (ref 0.1–1.0)
Monocytes Relative: 7.5 % (ref 3.0–12.0)
NEUTROS ABS: 3.9 10*3/uL (ref 1.4–7.7)
Neutrophils Relative %: 65.8 % (ref 43.0–77.0)
PLATELETS: 234 10*3/uL (ref 150.0–400.0)
RBC: 4.35 Mil/uL (ref 3.87–5.11)
RDW: 13.8 % (ref 11.5–15.5)
WBC: 6 10*3/uL (ref 4.0–10.5)

## 2014-10-07 LAB — HEPATIC FUNCTION PANEL
ALK PHOS: 32 U/L — AB (ref 39–117)
ALT: 12 U/L (ref 0–35)
AST: 14 U/L (ref 0–37)
Albumin: 4.2 g/dL (ref 3.5–5.2)
Bilirubin, Direct: 0.1 mg/dL (ref 0.0–0.3)
Total Bilirubin: 0.5 mg/dL (ref 0.2–1.2)
Total Protein: 7.3 g/dL (ref 6.0–8.3)

## 2014-10-07 LAB — LIPID PANEL
CHOLESTEROL: 224 mg/dL — AB (ref 0–200)
HDL: 41.6 mg/dL (ref >39.00)
LDL Cholesterol: 162 mg/dL — ABNORMAL HIGH (ref 0–99)
NonHDL: 182.24
TRIGLYCERIDES: 100 mg/dL (ref 0.0–149.0)
Total CHOL/HDL Ratio: 5
VLDL: 20 mg/dL (ref 0.0–40.0)

## 2014-10-07 LAB — TSH: TSH: 1.97 u[IU]/mL (ref 0.35–4.50)

## 2014-10-07 MED ORDER — ATORVASTATIN CALCIUM 10 MG PO TABS: 10.0000 mg | ORAL_TABLET | Freq: Every day | ORAL | Status: DC

## 2014-10-07 MED ORDER — ASPIRIN EC 81 MG PO TBEC: 81.0000 mg | DELAYED_RELEASE_TABLET | Freq: Every day | ORAL | Status: DC

## 2014-10-07 NOTE — Addendum Note (Signed)
Addended by: Anselm Jungling on: 10/07/2014 03:49 PM   Modules accepted: Orders

## 2014-10-07 NOTE — Telephone Encounter (Signed)
Miranda Green also to ask pt to start asa 81 mg, in addition to the lipitor 10 mg, to help prevent heart dz and stroke

## 2014-10-07 NOTE — Progress Notes (Addendum)
Subjective:    Patient ID: Miranda Green, female    DOB: [birth year], 54 y.o.   MRN: [id number]  HPI  Here for wellness and f/u;  Overall doing ok;  Pt denies Chest pain, worsening SOB, DOE, wheezing, orthopnea, PND, worsening LE edema, palpitations, dizziness or syncope, except for some ? Exertional mid chest discomfort intermittent in past 2 wks, with some sob/tightness, but no other assoc diaphoresis, n/v, palp or dizziness..  Pt denies neurological change such as new headache, facial or extremity weakness.  Pt denies polydipsia, polyuria, or low sugar symptoms. Pt states overall good compliance with treatment and medications, good tolerability, and has been trying to follow appropriate diet.  Pt denies worsening depressive symptoms, suicidal ideation or panic. No fever, night sweats, wt loss, loss of appetite, or other constitutional symptoms.  Pt states good ability with ADL's, has low fall risk, home safety reviewed and adequate, no other significant changes in hearing or vision, and only occasionally active with exercise. No complaints Has gained a few lbs Wt Readings from Last 3 Encounters:  10/07/14 176 lb (79.833 kg)  06/24/13 176 lb 8 oz (80.06 kg)  07/21/12 169 lb (76.658 kg)  Sister diagnosed last wk with pancreatic ca, but already had surgury Past Medical History  Diagnosis Date  . Hyperlipidemia   . Allergy   . ALLERGIC RHINITIS 10/13/2006  . ANXIETY 10/13/2006  . ASTHMA 10/13/2006  . DEPRESSION 10/13/2006    resolved  . HYPERLIPIDEMIA 10/13/2006    resolved  . ANEMIA-IRON DEFICIENCY 10/13/2006    resolved after had hysterectomy  . Bleeds easily     Has had problems when has surgery and birthing  . Hernia of abdominal wall    Past Surgical History  Procedure Laterality Date  . Abdominal hysterectomy    . Shoulder surgery  06/05/2010    release pressure on nerve  . Cesarean section  1993    reports that she has never smoked. She has never used smokeless tobacco. She  reports that she drinks about 0.6 oz of alcohol per week. She reports that she does not use illicit drugs. family history includes Colon polyps in her sister; Hypertension in her other. There is no history of Colon cancer, Esophageal cancer, Stomach cancer, or Rectal cancer. Allergies  Allergen Reactions  . Aspirin     REACTION: stomach upset   Current Outpatient Prescriptions on File Prior to Visit  Medication Sig Dispense Refill  . b complex vitamins tablet Take 1 tablet by mouth daily.      . naproxen (NAPROSYN) 500 MG tablet Take 1 tablet (500 mg total) by mouth 2 (two) times daily with a meal. 180 tablet 3  . phentermine 37.5 MG capsule Take 1 capsule (37.5 mg total) by mouth every morning. 30 capsule 2  . SUMAtriptan (IMITREX) 100 MG tablet Take 1 tablet (100 mg total) by mouth every 2 (two) hours as needed for migraine or headache. May repeat in 2 hours if headache persists or recurs. 10 tablet 11   No current facility-administered medications on file prior to visit.   Review of Systems  Constitutional: Negative for increased diaphoresis, other activity, appetite or siginficant weight change other than noted HENT: Negative for worsening hearing loss, ear pain, facial swelling, mouth sores and neck stiffness.   Eyes: Negative for other worsening pain, redness or visual disturbance.  Respiratory: Negative for shortness of Green and wheezing  Cardiovascular: Negative for chest pain and palpitations.  Gastrointestinal: Negative for diarrhea, blood  in stool, abdominal distention or other pain Genitourinary: Negative for hematuria, flank pain or change in urine volume.  Musculoskeletal: Negative for myalgias or other joint complaints.  Skin: Negative for color change and wound or drainage.  Neurological: Negative for syncope and numbness. other than noted Hematological: Negative for adenopathy. or other swelling Psychiatric/Behavioral: Negative for hallucinations, SI, self-injury,  decreased concentration or other worsening agitation.      Objective:   Physical Exam BP 108/76 mmHg  Pulse 75  Temp(Src) 98.6 F (37 C) (Oral)  Ht 5\' 5"  (1.651 m)  Wt 176 lb (79.833 kg)  BMI 29.29 kg/m2  SpO2 99% VS noted,  Constitutional: Pt is oriented to person, place, and time. Appears well-developed and well-nourished, in no significant distress Head: Normocephalic and atraumatic.  Right Ear: External ear normal.  Left Ear: External ear normal.  Nose: Nose normal.  Mouth/Throat: Oropharynx is clear and moist.  Eyes: Conjunctivae and EOM are normal. Pupils are equal, round, and reactive to light.  Neck: Normal range of motion. Neck supple. No JVD present. No tracheal deviation present or significant neck LA or mass Cardiovascular: Normal rate, regular rhythm, normal heart sounds and intact distal pulses.   Pulmonary/Chest: Effort normal and Green sounds without rales or wheezing  Abdominal: Soft. Bowel sounds are normal. NT. No HSM  Musculoskeletal: Normal range of motion. Exhibits no edema.  Lymphadenopathy:  Has no cervical adenopathy.  Neurological: Pt is alert and oriented to person, place, and time. Pt has normal reflexes. No cranial nerve deficit. Motor grossly intact Skin: Skin is warm and dry. No rash noted.  Psychiatric:  Has normal mood and affect. Behavior is normal.      Assessment & Plan:

## 2014-10-07 NOTE — Patient Instructions (Addendum)
Please continue all other medications as before, and refills have been done if requested.  Please have the pharmacy call with any other refills you may need.  Please continue your efforts at being more active, low cholesterol diet, and weight control.  You are otherwise up to date with prevention measures today.  You will be contacted regarding the referral for: stress test  Please keep your appointments with your specialists as you may have planned  Please go to the XRAY Department in the Basement (go straight as you get off the elevator) for the x-ray testing  Please go to the LAB in the Basement (turn left off the elevator) for the tests to be done today  You will be contacted by phone if any changes need to be made immediately.  Otherwise, you will receive a letter about your results with an explanation, but please check with MyChart first.  Please remember to sign up for MyChart if you have not done so, as this will be important to you in the future with finding out test results, communicating by private email, and scheduling acute appointments online when needed.  Please return in 1 year for your yearly visit, or sooner if needed, with Lab testing done 3-5 days before

## 2014-10-07 NOTE — Progress Notes (Signed)
Pre visit review using our clinic review tool, if applicable. No additional management support is needed unless otherwise documented below in the visit note. 

## 2014-10-07 NOTE — Assessment & Plan Note (Signed)
Atypical ,etiology unclear, ECG reviewed as per emr, also for cxr/stress test given age and risk factors

## 2014-10-07 NOTE — Assessment & Plan Note (Signed)

## 2014-10-07 NOTE — Addendum Note (Signed)
Addended by: Corwin Levins on: 10/07/2014 03:44 PM   Modules accepted: Orders, SmartSet

## 2014-10-11 ENCOUNTER — Telehealth: Payer: Self-pay | Admitting: Internal Medicine

## 2014-10-11 NOTE — Telephone Encounter (Signed)
Patient states that she has further questions regarding her lab results and the lipitor RX.

## 2014-10-11 NOTE — Telephone Encounter (Signed)
Pt advised in detail via personal VM 

## 2014-10-11 NOTE — Telephone Encounter (Signed)
Pt advised of lab values

## 2015-01-19 ENCOUNTER — Encounter (HOSPITAL_COMMUNITY): Payer: Federal, State, Local not specified - PPO

## 2015-01-23 ENCOUNTER — Telehealth (HOSPITAL_COMMUNITY): Payer: Self-pay | Admitting: *Deleted

## 2015-01-23 NOTE — Telephone Encounter (Signed)
Left message on voicemail in reference to upcoming appointment scheduled for 01/25/15. Phone number given for a call back so details instructions can be given. Miranda Green W   

## 2015-01-24 ENCOUNTER — Telehealth (HOSPITAL_COMMUNITY): Payer: Self-pay | Admitting: *Deleted

## 2015-01-24 ENCOUNTER — Telehealth (HOSPITAL_COMMUNITY): Payer: Self-pay

## 2015-01-24 NOTE — Telephone Encounter (Signed)
Patient given detailed instructions per Myocardial Perfusion Study Information Sheet for the test on 01/25/2015 at 9:45. Patient notified to arrive 15 minutes early and that it is imperative to arrive on time for appointment to keep from having the test rescheduled.  If you need to cancel or reschedule your appointment, please call the office within 24 hours of your appointment. Failure to do so may result in a cancellation of your appointment, and a $50 no show fee. Patient verbalized understanding.EHK

## 2015-01-24 NOTE — Telephone Encounter (Signed)
Left message on voicemail in reference to upcoming appointment scheduled for 01/25/15. Phone number given for a call back so details instructions can be given. Maili Shutters W   

## 2015-01-25 ENCOUNTER — Ambulatory Visit (HOSPITAL_COMMUNITY): Payer: Federal, State, Local not specified - PPO | Attending: Cardiovascular Disease

## 2015-01-25 ENCOUNTER — Encounter: Payer: Self-pay | Admitting: Internal Medicine

## 2015-01-25 DIAGNOSIS — R0609 Other forms of dyspnea: Secondary | ICD-10-CM | POA: Diagnosis not present

## 2015-01-25 DIAGNOSIS — R002 Palpitations: Secondary | ICD-10-CM | POA: Insufficient documentation

## 2015-01-25 DIAGNOSIS — R079 Chest pain, unspecified: Secondary | ICD-10-CM | POA: Diagnosis not present

## 2015-01-25 DIAGNOSIS — R9439 Abnormal result of other cardiovascular function study: Secondary | ICD-10-CM | POA: Insufficient documentation

## 2015-01-25 LAB — MYOCARDIAL PERFUSION IMAGING
CHL RATE OF PERCEIVED EXERTION: 18
CSEPEW: 8.9 METS
CSEPPHR: 162 {beats}/min
Exercise duration (min): 7 min
Exercise duration (sec): 15 s
LV dias vol: 76 mL
LVSYSVOL: 31 mL
MPHR: 166 {beats}/min
Percent HR: 98 %
RATE: 0.33
Rest HR: 59 {beats}/min
SDS: 0
SRS: 1
SSS: 1
TID: 1.02

## 2015-01-25 MED ORDER — TECHNETIUM TC 99M SESTAMIBI GENERIC - CARDIOLITE
10.8000 | Freq: Once | INTRAVENOUS | Status: AC | PRN
Start: 2015-01-25 — End: 2015-01-25
  Administered 2015-01-25: 11 via INTRAVENOUS

## 2015-01-25 MED ORDER — TECHNETIUM TC 99M SESTAMIBI GENERIC - CARDIOLITE
30.7000 | Freq: Once | INTRAVENOUS | Status: AC | PRN
  Administered 2015-01-25: 30.7 via INTRAVENOUS

## 2015-02-06 ENCOUNTER — Telehealth: Payer: Self-pay | Admitting: *Deleted

## 2015-02-06 NOTE — Telephone Encounter (Signed)
Left msg on triage requesting stress test results...Miranda Green/lmb

## 2015-02-07 NOTE — Telephone Encounter (Signed)
Notified pt gave her md response. She stated she actually received letter over the weekend...Raechel Chute/lmb

## 2015-02-07 NOTE — Telephone Encounter (Signed)
Letter was sent after stress test dec 7, maybe she did not receive it  [facility]  [facility]  The test results show that your current treatment is OK, as there was No slowing blood flow found (negative stress test)..   There is no need for change of treatment or further evaluation based on these results at this time.    Please otherwise continue the same plan for further evaluation and treatment as discussed.

## 2015-03-28 ENCOUNTER — Ambulatory Visit (INDEPENDENT_AMBULATORY_CARE_PROVIDER_SITE_OTHER): Payer: Federal, State, Local not specified - PPO | Admitting: Internal Medicine

## 2015-03-28 VITALS — BP 122/75 | HR 74 | Temp 98.2°F | Resp 16 | Ht 66.0 in | Wt 171.6 lb

## 2015-03-28 DIAGNOSIS — L03119 Cellulitis of unspecified part of limb: Secondary | ICD-10-CM | POA: Diagnosis not present

## 2015-03-28 DIAGNOSIS — L02419 Cutaneous abscess of limb, unspecified: Secondary | ICD-10-CM

## 2015-03-28 MED ORDER — DOXYCYCLINE HYCLATE 100 MG PO TABS: 100.0000 mg | ORAL_TABLET | Freq: Two times a day (BID) | ORAL | Status: DC

## 2015-03-28 MED ORDER — MUPIROCIN CALCIUM 2 % EX CREA: 1.0000 "application " | TOPICAL_CREAM | Freq: Two times a day (BID) | CUTANEOUS | Status: DC

## 2015-03-28 NOTE — Progress Notes (Signed)
Subjective:  By signing my name below, I, [name], attest that this documentation has been prepared under the direction and in the presence of [name], MD Electronically Signed: Charline Bills, ED Scribe 03/28/2015 at 6:51 PM.   Patient ID: Miranda Green, female    DOB: 02/11/1961, 55 y.o.   MRN: [id number]  Chief Complaint  Patient presents with  . Leg Pain    left    HPI HPI Comments: Miranda Green is a 55 y.o. female, who presents to the [facility] complaining of a new non-draining, painful area of swelling to her left lower leg first noticed a few days ago. She reports constant pain to the area that is exacerbated with palpation and bearing weight. She also reports associated redness to the area. Hx of recurrent follicular spots with a "pimple" that cultured Staph, but has only been treated by topicals--never needed I&D.  Pt has never been prescribed antibiotics for previous abscesses. She denies family h/o abscesses.   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   Past Medical History  Diagnosis Date  . Hyperlipidemia   . Allergy   . ALLERGIC RHINITIS 10/13/2006  . ANXIETY 10/13/2006  . ASTHMA 10/13/2006  . DEPRESSION 10/13/2006    resolved  . HYPERLIPIDEMIA 10/13/2006    resolved  . ANEMIA-IRON DEFICIENCY 10/13/2006    resolved after had hysterectomy  . Bleeds easily [facility])     Has had problems when has surgery and birthing  . Hernia of abdominal wall    Current Outpatient Prescriptions on File Prior to Visit  Medication Sig Dispense Refill  . aspirin EC 81 MG tablet Take 1 tablet (81 mg total) by mouth daily. 90 tablet 11  . atorvastatin (LIPITOR) 10 MG tablet Take 1 tablet (10 mg total) by mouth daily. (Patient not taking: Reported on 03/28/2015) 90 tablet 3  . naproxen (NAPROSYN) 500 MG tablet Take 1 tablet (500 mg total) by mouth 2 (two) times daily with a meal. (Patient not taking: Reported on 03/28/2015) 180 tablet 3  . phentermine  37.5 MG capsule Take 1 capsule (37.5 mg total) by mouth every morning. (Patient not taking: Reported on 03/28/2015) 30 capsule 2  . SUMAtriptan (IMITREX) 100 MG tablet Take 1 tablet (100 mg total) by mouth every 2 (two) hours as needed for migraine or headache. May repeat in 2 hours if headache persists or recurs. (Patient not taking: Reported on 03/28/2015) 10 tablet 11   No current facility-administered medications on file prior to visit.   Allergies  Allergen Reactions  . Aspirin     REACTION: stomach upset   Review of Systems  Skin: Positive for color change.       + Abscess  no fever/chills Eczema asympto    Objective:   Physical Exam  Constitutional: She is oriented to person, place, and time. She appears well-developed and well-nourished. No distress.  HENT:  Head: Normocephalic and atraumatic.  Eyes: Conjunctivae and EOM are normal.  Neck: Neck supple.  Cardiovascular: Normal rate.   Pulmonary/Chest: Effort normal. No respiratory distress.  Musculoskeletal: Normal range of motion.  Neurological: She is alert and oriented to person, place, and time.  Skin: Skin is warm and dry.  L leg there is a pustule with a surrounding area of erythema 4 cm x 3 cm that is tender to palpation.   Areas of hypig in perifollic over abd and thighs  Procedure: lanced with 18 gauge needle and culture  obtained.   Psychiatric: She has a normal mood and affect. Her behavior is normal.  Nursing note and vitals reviewed.     Assessment & Plan:   I have completed the patient encounter in its entirety as documented by the scribe, with editing by me where necessary. Rabecka Brendel P. Merla Riches, M.D.  Cellulitis and abscess of leg - Plan: Wound culture  Meds ordered this encounter  Medications  . Multiple Vitamin (MULTI VITAMIN DAILY PO)    Sig: Take by mouth.  . doxycycline (VIBRA-TABS) 100 MG tablet    Sig: Take 1 tablet (100 mg total) by mouth 2 (two) times daily.    Dispense:  20 tablet     Refill:  0  . mupirocin cream (BACTROBAN) 2 %    Sig: Apply 1 application topically 2 (two) times daily. intranasally for 30 days    Dispense:  15 g    Refill:  0

## 2015-04-01 LAB — WOUND CULTURE
GRAM STAIN: NONE SEEN
Gram Stain: NONE SEEN
Gram Stain: NONE SEEN

## 2015-04-05 ENCOUNTER — Telehealth: Payer: Self-pay

## 2015-04-05 NOTE — Telephone Encounter (Signed)
Per Judeth Cornfield English pt is fine to be around her sister as long as she is not feeling ill. Pt has been on doxy for 8 days now. Spoke to pt and let her know.

## 2015-04-05 NOTE — Telephone Encounter (Signed)
Pt would like her lab results. She was seen on 2/7 for a wound culture. She would like  to visit her sister who is going through chemotherapy and she doesn't want to visit her if she could make her sick. CB # [phone]

## 2015-07-12 ENCOUNTER — Other Ambulatory Visit: Payer: Self-pay

## 2015-07-12 DIAGNOSIS — Z1231 Encounter for screening mammogram for malignant neoplasm of breast: Secondary | ICD-10-CM

## 2015-08-09 ENCOUNTER — Ambulatory Visit
Admission: RE | Admit: 2015-08-09 | Discharge: 2015-08-09 | Disposition: A | Discharge status: Home / Self Care | Payer: Federal, State, Local not specified - PPO | Source: Ambulatory Visit

## 2015-08-09 DIAGNOSIS — Z1231 Encounter for screening mammogram for malignant neoplasm of breast: Secondary | ICD-10-CM

## 2015-08-09 DIAGNOSIS — K080 Exfoliation of teeth due to systemic causes: Secondary | ICD-10-CM | POA: Diagnosis not present

## 2015-09-22 ENCOUNTER — Emergency Department (HOSPITAL_COMMUNITY): Payer: Federal, State, Local not specified - PPO

## 2015-09-22 ENCOUNTER — Emergency Department (HOSPITAL_COMMUNITY)
Admission: EM | Admit: 2015-09-22 | Discharge: 2015-09-22 | Disposition: A | Discharge status: Home / Self Care | Payer: Federal, State, Local not specified - PPO | Attending: Emergency Medicine | Admitting: Emergency Medicine

## 2015-09-22 ENCOUNTER — Encounter (INDEPENDENT_AMBULATORY_CARE_PROVIDER_SITE_OTHER): Payer: Self-pay

## 2015-09-22 ENCOUNTER — Ambulatory Visit: Payer: Federal, State, Local not specified - PPO | Admitting: Internal Medicine

## 2015-09-22 ENCOUNTER — Encounter (HOSPITAL_COMMUNITY): Payer: Self-pay | Admitting: Emergency Medicine

## 2015-09-22 ENCOUNTER — Encounter: Payer: Self-pay | Admitting: Gastroenterology

## 2015-09-22 ENCOUNTER — Ambulatory Visit (HOSPITAL_COMMUNITY): Payer: Federal, State, Local not specified - PPO

## 2015-09-22 ENCOUNTER — Ambulatory Visit (INDEPENDENT_AMBULATORY_CARE_PROVIDER_SITE_OTHER): Payer: Federal, State, Local not specified - PPO | Admitting: Gastroenterology

## 2015-09-22 VITALS — BP 108/70 | HR 76 | Ht 65.0 in | Wt 173.2 lb

## 2015-09-22 DIAGNOSIS — R112 Nausea with vomiting, unspecified: Secondary | ICD-10-CM

## 2015-09-22 DIAGNOSIS — R109 Unspecified abdominal pain: Secondary | ICD-10-CM | POA: Diagnosis not present

## 2015-09-22 DIAGNOSIS — Z79899 Other long term (current) drug therapy: Secondary | ICD-10-CM | POA: Diagnosis not present

## 2015-09-22 DIAGNOSIS — R1011 Right upper quadrant pain: Secondary | ICD-10-CM | POA: Diagnosis not present

## 2015-09-22 DIAGNOSIS — J45909 Unspecified asthma, uncomplicated: Secondary | ICD-10-CM | POA: Diagnosis not present

## 2015-09-22 DIAGNOSIS — R110 Nausea: Secondary | ICD-10-CM | POA: Diagnosis not present

## 2015-09-22 DIAGNOSIS — R1013 Epigastric pain: Secondary | ICD-10-CM | POA: Diagnosis not present

## 2015-09-22 LAB — COMPREHENSIVE METABOLIC PANEL
ALK PHOS: 39 U/L (ref 38–126)
ALT: 22 U/L (ref 14–54)
AST: 33 U/L (ref 15–41)
Albumin: 4.8 g/dL (ref 3.5–5.0)
Anion gap: 7 (ref 5–15)
BILIRUBIN TOTAL: 1.2 mg/dL (ref 0.3–1.2)
BUN: 14 mg/dL (ref 6–20)
CO2: 26 mmol/L (ref 22–32)
Calcium: 9.1 mg/dL (ref 8.9–10.3)
Chloride: 103 mmol/L (ref 101–111)
Creatinine, Ser: 0.71 mg/dL (ref 0.44–1.00)
GFR calc Af Amer: >60 mL/min (ref >60)
GLUCOSE: 85 mg/dL (ref 65–99)
Potassium: 4.4 mmol/L (ref 3.5–5.1)
Sodium: 136 mmol/L (ref 135–145)
TOTAL PROTEIN: 8.3 g/dL — AB (ref 6.5–8.1)

## 2015-09-22 LAB — CBC
HCT: 39.9 % (ref 36.0–46.0)
Hemoglobin: 13.4 g/dL (ref 12.0–15.0)
MCH: 28.6 pg (ref 26.0–34.0)
MCHC: 33.6 g/dL (ref 30.0–36.0)
MCV: 85.3 fL (ref 78.0–100.0)
PLATELETS: 255 10*3/uL (ref 150–400)
RBC: 4.68 MIL/uL (ref 3.87–5.11)
RDW: 13.6 % (ref 11.5–15.5)
WBC: 5.7 10*3/uL (ref 4.0–10.5)

## 2015-09-22 LAB — LIPASE, BLOOD: Lipase: 23 U/L (ref 11–51)

## 2015-09-22 MED ORDER — OXYCODONE-ACETAMINOPHEN 5-325 MG PO TABS: 1.0000 | ORAL_TABLET | ORAL | 0 refills | Status: DC | PRN

## 2015-09-22 MED ORDER — DICYCLOMINE HCL 20 MG PO TABS: 20.0000 mg | ORAL_TABLET | Freq: Two times a day (BID) | ORAL | 0 refills | Status: DC

## 2015-09-22 MED ORDER — ONDANSETRON 4 MG PO TBDP: 4.0000 mg | ORAL_TABLET | Freq: Three times a day (TID) | ORAL | 0 refills | Status: DC | PRN

## 2015-09-22 NOTE — Discharge Instructions (Signed)
Take the prescribed medication as directed if needed for any recurrent pain.  If you remain asymptomatic you do not have to take them. Follow-up with Dr. Myrtie Neither for re-check.  Have him look at your labs and review your ultrasound from today. Return to the ED for new or worsening symptoms.

## 2015-09-22 NOTE — ED Notes (Signed)
U/s at bedside

## 2015-09-22 NOTE — Progress Notes (Signed)
[facility] Consult Note:  History: Miranda Green 09/22/2015  Referring physician: Oliver Barre, MD  Reason for consult/chief complaint: Abdominal Pain (severe epigastric/upper and pain that wakes me up now has moved to RUQ) and Nausea   Subjective  HPI:  Miranda Green was referred for abdominal pain that has worsened and changed over the last several days. It woke her from sleep 4 nights ago, and has gotten progressively worse since then. It feels like a tightness and an ache that started in the epigastrium but now has moved to the right upper quadrant. It is worse with a deep breath, she has noticed no other clear triggers or relieving factors. She denies vomiting but has been somewhat nauseated. She denies early satiety or dysphagia. Initially, she thought might have something to do with constipation, but she has had good bowel movements last 2 days with no improvement in the pain. She denies rectal bleeding. [name] had a normal colonoscopy with Dr. Jarold Motto in March 2013.  ROS:  Review of Systems  Constitutional: Negative for appetite change and unexpected weight change.  HENT: Negative for mouth sores and voice change.   Eyes: Negative for pain and redness.  Respiratory: Negative for cough and shortness of breath.   Cardiovascular: Negative for chest pain and palpitations.  Genitourinary: Negative for dysuria and hematuria.  Musculoskeletal: Negative for arthralgias and myalgias.  Skin: Negative for pallor and rash.  Neurological: Negative for weakness and headaches.  Hematological: Negative for adenopathy.     Past Medical History: Past Medical History:  Diagnosis Date  . ALLERGIC RHINITIS 10/13/2006  . ANEMIA-IRON DEFICIENCY 10/13/2006   resolved after had hysterectomy  . ANXIETY 10/13/2006  . ASTHMA 10/13/2006  . Bleeds easily [facility])    Has had problems when has surgery and birthing  . DEPRESSION 10/13/2006   resolved  . Hernia of abdominal wall   . HYPERLIPIDEMIA  10/13/2006   resolved  . Migraines      Past Surgical History: Past Surgical History:  Procedure Laterality Date  . ABDOMINAL HYSTERECTOMY    . CESAREAN SECTION  1993  . NECK SURGERY Left 06/05/2010   release pressure on nerve of left shoulder     Family History: Family History  Problem Relation Age of Onset  . Hypertension Other     entire family  . Colon polyps Sister   . Breast cancer Sister   . Diabetes Sister     x 6  . Heart disease Mother   . Diabetes Mother   . Anuerysm Father     brain  . Diabetes Father   . Breast cancer Other   . Diabetes Brother     x 2  . Colon cancer Neg Hx   . Esophageal cancer Neg Hx   . Stomach cancer Neg Hx   . Rectal cancer Neg Hx     Social History: Social History   Social History  . Marital status: Married    Spouse name: N/A  . Number of children: 2  . Years of education: N/A   Occupational History  . bankrupcy specialist [employer]     Social History Main Topics  . Smoking status: Never Smoker  . Smokeless tobacco: Never Used  . Alcohol use 0.6 oz/week    1 Glasses of wine per week     Comment: 2-3 per week  . Drug use: No  . Sexual activity: Not Asked   Other Topics Concern  . None   Social History Narrative  .  None    Allergies: Allergies  Allergen Reactions  . Aspirin     REACTION: stomach upset    Outpatient Meds: Current Outpatient Prescriptions  Medication Sig Dispense Refill  . Multiple Vitamin (MULTI VITAMIN DAILY PO) Take 1 tablet by mouth daily.      No current facility-administered medications for this visit.       ___________________________________________________________________ Objective   Exam:  BP 108/70 (BP Location: Left Arm, Patient Position: Sitting, Cuff Size: Normal)   Pulse 76   Ht  (1.651 m) Comment: height measured without shoes  Wt 173 lb 4 oz (78.6 kg)   BMI 28.83 kg/m    General: this is a(n) Well-appearing middle-aged woman   Eyes: sclera  anicteric, no redness  ENT: oral mucosa moist without lesions, no cervical or supraclavicular lymphadenopathy, good dentition  CV: RRR without murmur, S1/S2, no JVD, no peripheral edema  Resp: clear to auscultation bilaterally, normal RR and effort noted  GI: soft,  RUQ tenderness with a positive Murphy sign, with active bowel sounds. No  palpable organomegaly noted.  Skin; warm and dry, no rash or jaundice noted  Neuro: awake, alert and oriented x 3. Normal gross motor function and fluent speech  Assessment: Encounter Diagnoses  Name Primary?  . RUQ pain Yes  . Non-intractable vomiting with nausea, unspecified vomiting type     I think she has subacute cholecystitis. We were unable to get her an outpatient ultrasound today, so I sent her to the [facility] for CBC, CMP, and RUQ ultrasound. I spoke to the triage desk in order to expedite her workup. Further plan will follow.  Thank you for the courtesy of this consult.  Please call me with any questions or concerns.  Charlie Pitter III  CC: Oliver Barre, MD

## 2015-09-22 NOTE — ED Notes (Signed)
Called pt back to triage x1 with no response 

## 2015-09-22 NOTE — ED Provider Notes (Signed)
[facility]-EMERGENCY DEPT Provider Note   CSN: [id number] Arrival date & time: 09/22/15  1234  First Provider Contact:  First MD Initiated Contact with Patient 09/22/15 1302        History   Chief Complaint Chief Complaint  Patient presents with  . Abdominal Pain    HPI Miranda Green is a 55 y.o. female.  The history is provided by the patient.  Abdominal Pain   Associated symptoms include nausea.   55 year old female with history of iron deficiency anemia, anxiety, asthma, depression, hyperlipidemia, migraines, presenting to the ED for abdominal pain. Patient states on Monday she began having epigastric pain which was severe in nature. She reported some associated nausea. States on Wednesday the pain seemed to move more towards her right upper quadrant. She states last night pain got worse after eating a salad with [place] dressing but was better upon waking this morning. States her food tasted fine but she does have a persistent sour taste in her mouth.  She went saw her GI doctor this morning, Dr. Myrtie Neither, who had some concerns about her gallbladder. She was unable to be scheduled for an outpatient ultrasound so was sent here for further evaluation.  States she has been able to eat and drink this morning. She denies any fever or chills. She had a bowel movement yesterday which was normal. She is able to pass flatus. She's not had any active vomiting.  No chest pain or SOB.  No cardiac hx.  Prior abdominal surgeries include cesarean section and hysterectomy. 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 Patient denies any current symptoms on arrival to the ED.  Past Medical History:  Diagnosis Date  . ALLERGIC RHINITIS 10/13/2006  . ANEMIA-IRON DEFICIENCY 10/13/2006   resolved after had hysterectomy  . ANXIETY 10/13/2006  . ASTHMA 10/13/2006  . Bleeds easily [facility])    Has had problems when has surgery and birthing  . DEPRESSION 10/13/2006   resolved  . Hernia of abdominal wall   .  HYPERLIPIDEMIA 10/13/2006   resolved  . Migraines     Patient Active Problem List   Diagnosis Date Noted  . Chest pain 10/07/2014  . Migraine 06/26/2013  . Rash 08/18/2011  . Preventative health care 02/05/2011  . CERVICAL RADICULOPATHY, LEFT 04/05/2010  . BREAST CYST, RIGHT 03/19/2010  . PALPITATIONS, RECURRENT 03/16/2010  . FREQUENCY, URINARY 03/16/2010  . SHOULDER PAIN, LEFT 11/10/2009  . ELBOW PAIN, RIGHT 11/10/2009  . SUPERFICIAL THROMBOPHLEBITIS 05/04/2007  . HYPERLIPIDEMIA 10/13/2006  . ANEMIA-IRON DEFICIENCY 10/13/2006  . ANXIETY 10/13/2006  . DEPRESSION 10/13/2006  . ALLERGIC RHINITIS 10/13/2006  . ASTHMA 10/13/2006  . COLONIC POLYPS, HX OF 10/13/2006  . OBESITY 10/07/2006    Past Surgical History:  Procedure Laterality Date  . ABDOMINAL HYSTERECTOMY    . CESAREAN SECTION  1993  . NECK SURGERY Left 06/05/2010   release pressure on nerve of left shoulder    OB History    No data available       Home Medications    Prior to Admission medications   Medication Sig Start Date End Date Taking? Authorizing Provider  Multiple Vitamin (MULTI VITAMIN DAILY PO) Take 1 tablet by mouth daily.    Yes Historical Provider, MD    Family History Family History  Problem Relation Age of Onset  . Hypertension Other     entire family  . Colon polyps Sister   . Breast cancer Sister   . Diabetes Sister  x 6  . Heart disease Mother   . Diabetes Mother   . Anuerysm Father     brain  . Diabetes Father   . Breast cancer Other   . Diabetes Brother     x 2  . Colon cancer Neg Hx   . Esophageal cancer Neg Hx   . Stomach cancer Neg Hx   . Rectal cancer Neg Hx     Social History Social History  Substance Use Topics  . Smoking status: Never Smoker  . Smokeless tobacco: Never Used  . Alcohol use 0.6 oz/week    1 Glasses of wine per week     Comment: 2-3 per week     Allergies   Aspirin   Review of Systems Review of Systems  Gastrointestinal: Positive  for abdominal pain and nausea.  All other systems reviewed and are negative.    Physical Exam Updated Vital Signs BP 122/88 (BP Location: Left Arm)   Pulse 68   Temp 98.5 F (36.9 C) (Oral)   Resp 16   Ht 5\' 5"  (1.651 m)   Wt 78.5 kg   SpO2 100%   BMI 28.79 kg/m   Physical Exam  Constitutional: She is oriented to person, place, and time. She appears well-developed and well-nourished.  HENT:  Head: Normocephalic and atraumatic.  Mouth/Throat: Oropharynx is clear and moist.  Eyes: Conjunctivae and EOM are normal. Pupils are equal, round, and reactive to light.  Neck: Normal range of motion.  Cardiovascular: Normal rate, regular rhythm and normal heart sounds.   Pulmonary/Chest: Effort normal and breath sounds normal.  Abdominal: Soft. Bowel sounds are normal. There is no tenderness. There is no rigidity, no guarding, no tenderness at McBurney's point and negative Murphy's sign.  Abdomen soft, non-distended, no focal tenderness or peritoneal signs  Musculoskeletal: Normal range of motion.  Neurological: She is alert and oriented to person, place, and time.  Skin: Skin is warm and dry.  Psychiatric: She has a normal mood and affect.  Nursing note and vitals reviewed.    ED Treatments / Results  Labs (all labs ordered are listed, but only abnormal results are displayed) Labs Reviewed  COMPREHENSIVE METABOLIC PANEL - Abnormal; Notable for the following:       Result Value   Total Protein 8.3 (*)    All other components within normal limits  LIPASE, BLOOD  CBC    EKG  EKG Interpretation None       Radiology US Abdomen Complete  Result Date: 09/22/2015 CLINICAL DATA:  Right upper quadrant pain. EXAM: ABDOMEN ULTRASOUND COMPLETE COMPARISON:  None. FINDINGS: Gallbladder: No gallstones or wall thickening visualized. No sonographic Murphy sign noted by sonographer. Common bile duct: Diameter: 2.5 mm Liver: No focal lesion identified. Within normal limits in parenchymal  echogenicity. IVC: No abnormality visualized. Pancreas: Visualized portion unremarkable. Spleen: Size and appearance within normal limits. Right Kidney: Length: 12.2 cm. Echogenicity within normal limits. No mass or hydronephrosis visualized. Left Kidney: Length: 11.3 cm. Echogenicity within normal limits. No mass or hydronephrosis visualized. Abdominal aorta: No aneurysm visualized. Other findings: None. IMPRESSION: 1. Normal abdominal sonogram. Electronically Signed   By: Signa Kell M.D.   On: 09/22/2015 14:01    Procedures Procedures (including critical care time)  Medications Ordered in ED Medications - No data to display   Initial Impression / Assessment and Plan / ED Course  I have reviewed the triage vital signs and the nursing notes.  Pertinent labs & imaging results that were  available during my care of the patient were reviewed by me and considered in my medical decision making (see chart for details).  Clinical Course   55 year old female sent here from GI office for further evaluation of possible gallstones/cholecystitis. Patient is afebrile and nontoxic here. She has no abdominal pain currently and her exam is benign. Her lab work is reassuring. Ultrasound negative for any acute findings including gallstones or findings concerning for cholecystitis. Patient has remained pain-free here. Will have patient monitor her symptoms at home should they recur.  She was instructed to follow-up with her GI doctor.  Prescriptions given for pain medication if needed.  Discussed plan with patient, she acknowledged understanding and agreed with plan of care.  Return precautions given for new or worsening symptoms.  Final Clinical Impressions(s) / ED Diagnoses   Final diagnoses:  Abdominal pain, unspecified abdominal location    New Prescriptions Discharge Medication List as of 09/22/2015  3:34 PM    START taking these medications   Details  dicyclomine (BENTYL) 20 MG tablet Take 1 tablet  (20 mg total) by mouth 2 (two) times daily., Starting Fri 09/22/2015, Print    ondansetron (ZOFRAN ODT) 4 MG disintegrating tablet Take 1 tablet (4 mg total) by mouth every 8 (eight) hours as needed for nausea., Starting Fri 09/22/2015, Print    oxyCODONE-acetaminophen (PERCOCET/ROXICET) 5-325 MG tablet Take 1 tablet by mouth every 4 (four) hours as needed., Starting Fri 09/22/2015, Print         Garlon Hatchet, PA-C 09/22/15 1638    Linwood Dibbles, MD 09/26/15 [phone]

## 2015-09-22 NOTE — Patient Instructions (Addendum)
If you are age 55 or older, your body mass index should be between 23-30. Your Body mass index is 28.83 kg/m. If this is out of the aforementioned range listed, please consider follow up with your Primary Care Provider.  If you are age 29 or younger, your body mass index should be between 19-25. Your Body mass index is 28.83 kg/m. If this is out of the aformentioned range listed, please consider follow up with your Primary Care Provider.   Please go to the [facility] now and be seen for possible gallbladder problem.  We will cal to let them know you are coming.  Thank you for choosing [facility]  Dr Amada Jupiter III

## 2015-09-22 NOTE — ED Triage Notes (Signed)
Pt reports she was sent over by [facility] for eval of gallbladder. Pt began to have upper midline abd pain on Monday which moved to RUQ. Could not get outpatient US done today, so sent here.

## 2015-09-22 NOTE — ED Notes (Signed)
Discharge instructions, follow up care, and rx x3 reviewed with patient. Patient verbalized understanding. 

## 2015-09-25 ENCOUNTER — Telehealth: Payer: Self-pay

## 2015-09-25 NOTE — Telephone Encounter (Signed)
Sherrilyn RistHenry L Danis III, MD  Alexis Frockoni V Donald Memoli, CMA        I reviewed the ED reports, and that her gallbladder checked out fine. Surprises me, but I am glad to hear it.   There would seem to be some other cause for the RUQ pain and tenderness.   Please call her Monday AM. If she is still feeling that way, then I would like to schedule her for an upper endoscopy. If agreeable, make the arrangements.   Please copy this in to an encounter note for documentation.   - HD    Pt. contacted. She states that she is feeling much better with the current medication regimen. She agrees to proceed with the EGD. However she states she didn't have acces to her current calendar to schedule. Phone number given to call back.

## 2015-09-26 ENCOUNTER — Other Ambulatory Visit: Payer: Self-pay

## 2015-09-26 DIAGNOSIS — R1011 Right upper quadrant pain: Secondary | ICD-10-CM

## 2015-09-26 DIAGNOSIS — R112 Nausea with vomiting, unspecified: Secondary | ICD-10-CM

## 2015-09-27 ENCOUNTER — Encounter: Payer: Self-pay | Admitting: Gastroenterology

## 2015-09-27 ENCOUNTER — Encounter: Payer: Federal, State, Local not specified - PPO | Admitting: Gastroenterology

## 2015-09-27 ENCOUNTER — Ambulatory Visit (AMBULATORY_SURGERY_CENTER): Payer: Federal, State, Local not specified - PPO | Admitting: Gastroenterology

## 2015-09-27 VITALS — BP 130/75 | HR 56 | Temp 96.2°F | Resp 17 | Ht 65.0 in | Wt 173.0 lb

## 2015-09-27 DIAGNOSIS — R109 Unspecified abdominal pain: Secondary | ICD-10-CM | POA: Diagnosis not present

## 2015-09-27 DIAGNOSIS — R1011 Right upper quadrant pain: Secondary | ICD-10-CM

## 2015-09-27 DIAGNOSIS — K2950 Unspecified chronic gastritis without bleeding: Secondary | ICD-10-CM | POA: Diagnosis not present

## 2015-09-27 MED ORDER — SODIUM CHLORIDE 0.9 % IV SOLN: 500.0000 mL | INTRAVENOUS | Status: DC

## 2015-09-27 NOTE — Progress Notes (Signed)
Patient awakening,vss,report to rn 

## 2015-09-27 NOTE — Op Note (Signed)
[facility] Patient Name: Miranda Green Procedure Date: 09/27/2015 3:46 PM MRN: [id number] Endoscopist: Sherilyn Cooter L. Myrtie Neither , MD Age: 55 Referring MD:  Date of Birth: 09/12/1960 Gender: Female Account #: [id number] Procedure:                Upper GI endoscopy Indications:              Abdominal pain in the right upper quadrant (recent                            GB ultrasound and LFTs normal) Medicines:                Monitored Anesthesia Care Procedure:                Pre-Anesthesia Assessment:                           - Prior to the procedure, a History and Physical                            was performed, and patient medications and                            allergies were reviewed. The patient's tolerance of                            previous anesthesia was also reviewed. The risks                            and benefits of the procedure and the sedation                            options and risks were discussed with the patient.                            All questions were answered, and informed consent                            was obtained. Prior Anticoagulants: The patient has                            taken no previous anticoagulant or antiplatelet                            agents. ASA Grade Assessment: II - A patient with                            mild systemic disease. After reviewing the risks                            and benefits, the patient was deemed in                            satisfactory condition to undergo the procedure.  After obtaining informed consent, the endoscope was                            passed under direct vision. Throughout the                            procedure, the patient's blood pressure, pulse, and                            oxygen saturations were monitored continuously. The                            Model GIF-[id number]) scope was introduced                            through the mouth, and  advanced to the second part                            of duodenum. The upper GI endoscopy was                            accomplished without difficulty. The patient                            tolerated the procedure well. Scope In: Scope Out: Findings:                 The larynx was normal.                           The esophagus was normal.                           Multiple erosions were found in the gastric antrum.                            This was biopsied with a cold forceps for histology.                           The cardia and gastric fundus were normal on                            retroflexion.                           The examined duodenum was normal. Complications:            No immediate complications. Estimated Blood Loss:     Estimated blood loss: none. Impression:               - Normal larynx.                           - Normal esophagus.                           - Erosive gastropathy. Biopsied.                           -  Normal examined duodenum. Recommendation:           - Patient has a contact number available for                            emergencies. The signs and symptoms of potential                            delayed complications were discussed with the                            patient. Return to normal activities tomorrow.                            Written discharge instructions were provided to the                            patient.                           - Resume previous diet.                           - Continue present medications except percocet.                           - Await pathology results. Quentyn Kolbeck L. Myrtie Neither, MD 09/27/2015 4:05:07 PM This report has been signed electronically.

## 2015-09-27 NOTE — Patient Instructions (Addendum)
YOU HAD AN ENDOSCOPIC PROCEDURE TODAY AT THE [facility]:   Refer to the procedure report that was given to you for any specific questions about what was found during the examination.  If the procedure report does not answer your questions, please call your gastroenterologist to clarify.  If you requested that your care partner not be given the details of your procedure findings, then the procedure report has been included in a sealed envelope for you to review at your convenience later.  YOU SHOULD EXPECT: Some feelings of bloating in the abdomen. Passage of more gas than usual.  Walking can help get rid of the air that was put into your GI tract during the procedure and reduce the bloating. If you had a lower endoscopy (such as a colonoscopy or flexible sigmoidoscopy) you may notice spotting of blood in your stool or on the toilet paper. If you underwent a bowel prep for your procedure, you may not have a normal bowel movement for a few days.  Please Note:  You might notice some irritation and congestion in your nose or some drainage.  This is from the oxygen used during your procedure.  There is no need for concern and it should clear up in a day or so.  SYMPTOMS TO REPORT IMMEDIATELY:   Following lower endoscopy (colonoscopy or flexible sigmoidoscopy):  Excessive amounts of blood in the stool  Significant tenderness or worsening of abdominal pains  Swelling of the abdomen that is new, acute  Fever of 100F or higher   Following upper endoscopy (EGD)  Vomiting of blood or coffee ground material  New chest pain or pain under the shoulder blades  Painful or persistently difficult swallowing  New shortness of breath  Fever of 100F or higher  Black, tarry-looking stools  For urgent or emergent issues, a gastroenterologist can be reached at any hour by calling [phone].   DIET: Your first meal following the procedure should be a small meal and then it is ok to progress to  your normal diet. Heavy or fried foods are harder to digest and may make you feel nauseous or bloated.  Likewise, meals heavy in dairy and vegetables can increase bloating.  Drink plenty of fluids but you should avoid alcoholic beverages for 24 hours.  ACTIVITY:  You should plan to take it easy for the rest of today and you should NOT DRIVE or use heavy machinery until tomorrow (because of the sedation medicines used during the test).    FOLLOW UP: Our staff will call the number listed on your records the next business day following your procedure to check on you and address any questions or concerns that you may have regarding the information given to you following your procedure. If we do not reach you, we will leave a message.  However, if you are feeling well and you are not experiencing any problems, there is no need to return our call.  We will assume that you have returned to your regular daily activities without incident.  If any biopsies were taken you will be contacted by phone or by letter within the next 1-3 weeks.  Please call us at [phone] if you have not heard about the biopsies in 3 weeks.    SIGNATURES/CONFIDENTIALITY: You and/or your care partner have signed paperwork which will be entered into your electronic medical record.  These signatures attest to the fact that that the information above on your After Visit Summary has been reviewed   and is understood.  Full responsibility of the confidentiality of this discharge information lies with you and/or your care-partner.  Await biopsy results. 

## 2015-09-27 NOTE — Progress Notes (Signed)
Called to room to assist during endoscopic procedure.  Patient ID and intended procedure confirmed with present staff. Received instructions for my participation in the procedure from the performing physician.  

## 2015-09-28 ENCOUNTER — Telehealth: Payer: Self-pay

## 2015-09-28 NOTE — Telephone Encounter (Signed)
Pt completed the EGD on 09-26-2015

## 2015-09-28 NOTE — Telephone Encounter (Signed)
  Follow up Call-  Call back number 09/27/2015  Post procedure Call Back phone  # [phone]  Permission to leave phone message Yes  Some recent data might be hidden     Patient questions:  Do you have a fever, pain , or abdominal swelling? No. Pain Score  0 *  Have you tolerated food without any problems? Yes.    Have you been able to return to your normal activities? Yes.    Do you have any questions about your discharge instructions: Diet   No. Medications  No. Follow up visit  No.  Do you have questions or concerns about your Care? No.  Actions: * If pain score is 4 or above: No action needed, pain <4.

## 2015-10-05 ENCOUNTER — Encounter: Payer: Self-pay | Admitting: Gastroenterology

## 2015-12-12 DIAGNOSIS — K080 Exfoliation of teeth due to systemic causes: Secondary | ICD-10-CM | POA: Diagnosis not present

## 2016-04-10 DIAGNOSIS — K080 Exfoliation of teeth due to systemic causes: Secondary | ICD-10-CM | POA: Diagnosis not present

## 2016-06-19 ENCOUNTER — Other Ambulatory Visit (INDEPENDENT_AMBULATORY_CARE_PROVIDER_SITE_OTHER): Payer: Federal, State, Local not specified - PPO

## 2016-06-19 ENCOUNTER — Other Ambulatory Visit: Payer: Self-pay | Admitting: Internal Medicine

## 2016-06-19 ENCOUNTER — Encounter: Payer: Self-pay | Admitting: Internal Medicine

## 2016-06-19 ENCOUNTER — Ambulatory Visit (INDEPENDENT_AMBULATORY_CARE_PROVIDER_SITE_OTHER): Payer: Federal, State, Local not specified - PPO | Admitting: Internal Medicine

## 2016-06-19 ENCOUNTER — Telehealth: Payer: Self-pay

## 2016-06-19 VITALS — BP 106/70 | HR 82 | Ht 65.0 in | Wt 179.0 lb

## 2016-06-19 DIAGNOSIS — L739 Follicular disorder, unspecified: Secondary | ICD-10-CM | POA: Insufficient documentation

## 2016-06-19 DIAGNOSIS — Z114 Encounter for screening for human immunodeficiency virus [HIV]: Secondary | ICD-10-CM

## 2016-06-19 DIAGNOSIS — Z0000 Encounter for general adult medical examination without abnormal findings: Secondary | ICD-10-CM

## 2016-06-19 DIAGNOSIS — Z1159 Encounter for screening for other viral diseases: Secondary | ICD-10-CM

## 2016-06-19 DIAGNOSIS — L309 Dermatitis, unspecified: Secondary | ICD-10-CM | POA: Insufficient documentation

## 2016-06-19 DIAGNOSIS — H9193 Unspecified hearing loss, bilateral: Secondary | ICD-10-CM

## 2016-06-19 DIAGNOSIS — H9191 Unspecified hearing loss, right ear: Secondary | ICD-10-CM | POA: Diagnosis not present

## 2016-06-19 LAB — BASIC METABOLIC PANEL
BUN: 10 mg/dL (ref 6–23)
CALCIUM: 9.4 mg/dL (ref 8.4–10.5)
CO2: 29 meq/L (ref 19–32)
CREATININE: 0.78 mg/dL (ref 0.40–1.20)
Chloride: 104 mEq/L (ref 96–112)
GFR: 98.1 mL/min (ref >60.00)
GLUCOSE: 76 mg/dL (ref 70–99)
Potassium: 3.9 mEq/L (ref 3.5–5.1)
Sodium: 140 mEq/L (ref 135–145)

## 2016-06-19 LAB — CBC WITH DIFFERENTIAL/PLATELET
BASOS ABS: 0 10*3/uL (ref 0.0–0.1)
Basophils Relative: 0.9 % (ref 0.0–3.0)
Eosinophils Absolute: 0.2 10*3/uL (ref 0.0–0.7)
Eosinophils Relative: 4.2 % (ref 0.0–5.0)
HEMATOCRIT: 39.4 % (ref 36.0–46.0)
Hemoglobin: 13.1 g/dL (ref 12.0–15.0)
LYMPHS PCT: 29.3 % (ref 12.0–46.0)
Lymphs Abs: 1.4 10*3/uL (ref 0.7–4.0)
MCHC: 33.3 g/dL (ref 30.0–36.0)
MCV: 86.7 fl (ref 78.0–100.0)
MONOS PCT: 9 % (ref 3.0–12.0)
Monocytes Absolute: 0.4 10*3/uL (ref 0.1–1.0)
Neutro Abs: 2.8 10*3/uL (ref 1.4–7.7)
Neutrophils Relative %: 56.6 % (ref 43.0–77.0)
Platelets: 244 10*3/uL (ref 150.0–400.0)
RBC: 4.54 Mil/uL (ref 3.87–5.11)
RDW: 13.8 % (ref 11.5–15.5)
WBC: 4.9 10*3/uL (ref 4.0–10.5)

## 2016-06-19 LAB — HEPATIC FUNCTION PANEL
ALBUMIN: 4.3 g/dL (ref 3.5–5.2)
ALT: 33 U/L (ref 0–35)
AST: 22 U/L (ref 0–37)
Alkaline Phosphatase: 40 U/L (ref 39–117)
BILIRUBIN DIRECT: 0.1 mg/dL (ref 0.0–0.3)
TOTAL PROTEIN: 7.6 g/dL (ref 6.0–8.3)
Total Bilirubin: 0.7 mg/dL (ref 0.2–1.2)

## 2016-06-19 LAB — URINALYSIS, ROUTINE W REFLEX MICROSCOPIC
Bilirubin Urine: NEGATIVE
Hgb urine dipstick: NEGATIVE
KETONES UR: NEGATIVE
Leukocytes, UA: NEGATIVE
Nitrite: NEGATIVE
PH: 7 (ref 5.0–8.0)
RBC / HPF: NONE SEEN (ref 0–?)
SPECIFIC GRAVITY, URINE: 1.02 (ref 1.000–1.030)
Total Protein, Urine: NEGATIVE
URINE GLUCOSE: NEGATIVE
UROBILINOGEN UA: 0.2 (ref 0.0–1.0)
WBC UA: NONE SEEN (ref 0–?)

## 2016-06-19 LAB — LIPID PANEL
Cholesterol: 236 mg/dL — ABNORMAL HIGH (ref 0–200)
HDL: 42.4 mg/dL (ref >39.00)
LDL Cholesterol: 172 mg/dL — ABNORMAL HIGH (ref 0–99)
NONHDL: 193.38
TRIGLYCERIDES: 107 mg/dL (ref 0.0–149.0)
Total CHOL/HDL Ratio: 6
VLDL: 21.4 mg/dL (ref 0.0–40.0)

## 2016-06-19 LAB — TSH: TSH: 1.07 u[IU]/mL (ref 0.35–4.50)

## 2016-06-19 MED ORDER — ASPIRIN 81 MG PO TBEC: 81.0000 mg | DELAYED_RELEASE_TABLET | Freq: Every day | ORAL | 12 refills | Status: DC

## 2016-06-19 MED ORDER — ROSUVASTATIN CALCIUM 20 MG PO TABS: 20.0000 mg | ORAL_TABLET | Freq: Every day | ORAL | 3 refills | Status: DC

## 2016-06-19 NOTE — Progress Notes (Signed)
Subjective:    Patient ID: Miranda Green, female    DOB: Apr 17, 1960, 56 y.o.   MRN: [id number]  HPI    Here for wellness and f/u;  Overall doing ok;  Pt denies Chest pain, worsening SOB, DOE, wheezing, orthopnea, PND, worsening LE edema, palpitations, dizziness or syncope.  Pt denies neurological change such as new headache, facial or extremity weakness.  Pt denies polydipsia, polyuria, or low sugar symptoms. Pt states overall good compliance with treatment and medications, good tolerability, and has been trying to follow appropriate diet.  Pt denies worsening depressive symptoms, suicidal ideation or panic. No fever, night sweats, wt loss, loss of appetite, or other constitutional symptoms.  Pt states good ability with ADL's, has low fall risk, home safety reviewed and adequate, no other significant changes in hearing or vision, and only occasionally active with exercise. Declines tdap for now.  Wt Readings from Last 3 Encounters:  06/19/16 179 lb (81.2 kg)  09/27/15 173 lb (78.5 kg)  09/22/15 173 lb (78.5 kg)  Working from home. Dog is getting old, and activities outside the home cause irriatation. Sometimes just does not want to leave the house. Denies worsening depressive symptoms, suicidal ideation, or panic; has midl worsening anxiuety daily. Declines med for nerves at this time. Has some reduced hearing right ear ? Wax Past Medical History:  Diagnosis Date  . ALLERGIC RHINITIS 10/13/2006  . ANEMIA-IRON DEFICIENCY 10/13/2006   resolved after had hysterectomy  . ANXIETY 10/13/2006  . ASTHMA 10/13/2006  . Bleeds easily [facility])    Has had problems when has surgery and birthing  . DEPRESSION 10/13/2006   resolved  . Hernia of abdominal wall   . HYPERLIPIDEMIA 10/13/2006   resolved  . Migraines    Past Surgical History:  Procedure Laterality Date  . ABDOMINAL HYSTERECTOMY    . CESAREAN SECTION  1993  . NECK SURGERY Left 06/05/2010   release pressure on nerve of left shoulder    reports that she has never smoked. She has never used smokeless tobacco. She reports that she drinks about 0.6 oz of alcohol per week . She reports that she does not use drugs. family history includes Anuerysm in her father; Breast cancer in her other and sister; Colon polyps in her sister; Diabetes in her brother, father, mother, and sister; Heart disease in her mother; Hypertension in her other. Allergies  Allergen Reactions  . Aspirin     REACTION: stomach upset   Current Outpatient Prescriptions on File Prior to Visit  Medication Sig Dispense Refill  . dicyclomine (BENTYL) 20 MG tablet Take 1 tablet (20 mg total) by mouth 2 (two) times daily. 20 tablet 0  . Multiple Vitamin (MULTI VITAMIN DAILY PO) Take 1 tablet by mouth daily.      No current facility-administered medications on file prior to visit.    Review of Systems Constitutional: Negative for other unusual diaphoresis, sweats, appetite or weight changes HENT: Negative for other worsening hearing loss, ear pain, facial swelling, mouth sores or neck stiffness.   Eyes: Negative for other worsening pain, redness or other visual disturbance.  Respiratory: Negative for other stridor or swelling Cardiovascular: Negative for other palpitations or other chest pain  Gastrointestinal: Negative for worsening diarrhea or loose stools, blood in stool, distention or other pain Genitourinary: Negative for hematuria, flank pain or other change in urine volume.  Musculoskeletal: Negative for myalgias or other joint swelling.  Skin: Negative for other color change, or other wound or worsening drainage.  Neurological: Negative for other syncope or numbness. Hematological: Negative for other adenopathy or swelling Psychiatric/Behavioral: Negative for hallucinations, other worsening agitation, SI, self-injury, or new decreased concentration All other system neg per pt    Objective:   Physical Exam BP 106/70   Pulse 82   Ht  (1.651 m)   Wt  179 lb (81.2 kg)   SpO2 100%   BMI 29.79 kg/m  VS noted,  Constitutional: Pt is oriented to person, place, and time. Appears well-developed and well-nourished, in no significant distress and comfortable Head: Normocephalic and atraumatic  Eyes: Conjunctivae and EOM are normal. Pupils are equal, round, and reactive to light Right Ear: External ear normal without discharge right ear wax impaction irrigated resolved Left Ear: External ear normal without discharge Nose: Nose without discharge or deformity Mouth/Throat: Oropharynx is without other ulcerations and moist  Neck: Normal range of motion. Neck supple. No JVD present. No tracheal deviation present or significant neck LA or mass Cardiovascular: Normal rate, regular rhythm, normal heart sounds and intact distal pulses.   Pulmonary/Chest: WOB normal and Green sounds without rales or wheezing  Abdominal: Soft. Bowel sounds are normal. NT. No HSM  Musculoskeletal: Normal range of motion. Exhibits no edema Lymphadenopathy: Has no other cervical adenopathy.  Neurological: Pt is alert and oriented to person, place, and time. Pt has normal reflexes. No cranial nerve deficit. Motor grossly intact, Gait intact Skin: Skin is warm and dry. No rash noted or new ulcerations Psychiatric:  Has nervous mood and affect. Behavior is normal without agitation No other exam findings  Lab Results  Component Value Date   WBC 4.9 06/19/2016   HGB 13.1 06/19/2016   HCT 39.4 06/19/2016   PLT 244.0 06/19/2016   GLUCOSE 76 06/19/2016   CHOL 236 (H) 06/19/2016   TRIG 107.0 06/19/2016   HDL 42.40 06/19/2016   LDLCALC 172 (H) 06/19/2016   ALT 33 06/19/2016   AST 22 06/19/2016   NA 140 06/19/2016   K 3.9 06/19/2016   CL 104 06/19/2016   CREATININE 0.78 06/19/2016   BUN 10 06/19/2016   CO2 29 06/19/2016   TSH 1.07 06/19/2016        Assessment & Plan:

## 2016-06-19 NOTE — Telephone Encounter (Signed)
LVM with detailed msg with below results from Dr. Jonny Ruiz.

## 2016-06-19 NOTE — Progress Notes (Signed)
Pre visit review using our clinic review tool, if applicable. No additional management support is needed unless otherwise documented below in the visit note. 

## 2016-06-19 NOTE — Patient Instructions (Signed)
Your right ear was irrigated of wax impaction today  Please continue all other medications as before, and refills have been done if requested.  Please have the pharmacy call with any other refills you may need.  Please continue your efforts at being more active, low cholesterol diet, and weight control.  You are otherwise up to date with prevention measures today.  Please keep your appointments with your specialists as you may have planned  Please go to the LAB in the Basement (turn left off the elevator) for the tests to be done today  You will be contacted by phone if any changes need to be made immediately.  Otherwise, you will receive a letter about your results with an explanation, but please check with MyChart first.  Please remember to sign up for MyChart if you have not done so, as this will be important to you in the future with finding out test results, communicating by private email, and scheduling acute appointments online when needed.  Please return in 1 year for your yearly visit, or sooner if needed, with Lab testing done 3-5 days before

## 2016-06-19 NOTE — Telephone Encounter (Signed)
Pt has been informed.

## 2016-06-19 NOTE — Telephone Encounter (Signed)
-----   Message from Corwin Levins, MD sent at 06/19/2016  5:39 PM EDT ----- I will refer to ENT, thanks ----- Message ----- From: Roney Mans, CMA Sent: 06/19/2016   2:09 PM To: Corwin Levins, MD  Ear cleaning was unsuccessful. Would you like for her to get drops OTC and come back tomorrow for cleaning or do a referral to ENT?

## 2016-06-19 NOTE — Assessment & Plan Note (Signed)

## 2016-06-19 NOTE — Telephone Encounter (Signed)
-----   Message from Corwin Levins, MD sent at 06/19/2016  5:56 PM EDT ----- Letter sent, cont same tx except  The test results show that your current treatment is OK, except the LDL cholesterol is moderately high.  We should start a medication called crestor (generic) at 20 mg per day to reduce your future risk of heart disease and stroke.  Please also start Aspirin 81 mg - 1 per day which is OTC and important as well to reduce this risk.  Shirron to please inform pt, I will do rx

## 2016-06-20 ENCOUNTER — Telehealth: Payer: Self-pay | Admitting: Internal Medicine

## 2016-06-20 LAB — HIV ANTIBODY (ROUTINE TESTING W REFLEX): HIV: NONREACTIVE

## 2016-06-20 LAB — HEPATITIS C ANTIBODY: HCV Ab: NEGATIVE

## 2016-06-20 NOTE — Telephone Encounter (Signed)
Ok to try Lower cholesterol diet and we can recheck at the next visit.  But if not improved, she may want to start the medication

## 2016-06-20 NOTE — Telephone Encounter (Signed)
Patient has a questions about the medication she is being put on for her cholesterol. She really does not want to take medication. She would like to know if she can take some all natural medication and work out. Then have it rechecked in a couple months. If you would please follow up with the patient. Thank you.

## 2016-06-20 NOTE — Telephone Encounter (Signed)
LVM with a detailed msg with the following from Dr. Jonny RuizJohn.

## 2016-06-27 ENCOUNTER — Telehealth: Payer: Self-pay | Admitting: Internal Medicine

## 2016-06-27 DIAGNOSIS — E785 Hyperlipidemia, unspecified: Secondary | ICD-10-CM

## 2016-06-27 NOTE — Telephone Encounter (Signed)
Ok for Lab only in 6 months ; I will place order and pt should pick a good day about that time to have this done, thanks

## 2016-06-27 NOTE — Telephone Encounter (Signed)
Pt has been informed and expressed understanding.  

## 2016-06-27 NOTE — Telephone Encounter (Signed)
Pt is concerned about her cholesterol, the nurse told her that it would be checked at her next visit, she would like clarificatio whether that should be her next visit in 2019 or if she should come sooner  Please call back

## 2016-06-27 NOTE — Telephone Encounter (Signed)
Spoke with pt about her concerns and recalled the last conversation that we has about her cholesterol. She would like to know if she could come back in 6 months to have her cholesterol checked to see if she is on the right track as far as diet and exercise instead of waiting until her next physical next year to have it checked then? Please advise.

## 2016-07-04 ENCOUNTER — Other Ambulatory Visit: Payer: Self-pay | Admitting: Internal Medicine

## 2016-07-04 DIAGNOSIS — Z1231 Encounter for screening mammogram for malignant neoplasm of breast: Secondary | ICD-10-CM

## 2016-07-17 ENCOUNTER — Emergency Department (HOSPITAL_COMMUNITY)
Admission: EM | Admit: 2016-07-17 | Discharge: 2016-07-17 | Disposition: A | Discharge status: Home / Self Care | Payer: Federal, State, Local not specified - PPO | Attending: Emergency Medicine | Admitting: Emergency Medicine

## 2016-07-17 ENCOUNTER — Emergency Department (HOSPITAL_COMMUNITY): Payer: Federal, State, Local not specified - PPO

## 2016-07-17 ENCOUNTER — Encounter (HOSPITAL_COMMUNITY): Payer: Self-pay | Admitting: Emergency Medicine

## 2016-07-17 DIAGNOSIS — Y9301 Activity, walking, marching and hiking: Secondary | ICD-10-CM | POA: Insufficient documentation

## 2016-07-17 DIAGNOSIS — M25572 Pain in left ankle and joints of left foot: Secondary | ICD-10-CM | POA: Diagnosis not present

## 2016-07-17 DIAGNOSIS — Y929 Unspecified place or not applicable: Secondary | ICD-10-CM | POA: Insufficient documentation

## 2016-07-17 DIAGNOSIS — W19XXXA Unspecified fall, initial encounter: Secondary | ICD-10-CM

## 2016-07-17 DIAGNOSIS — S8991XA Unspecified injury of right lower leg, initial encounter: Secondary | ICD-10-CM | POA: Diagnosis not present

## 2016-07-17 DIAGNOSIS — M25571 Pain in right ankle and joints of right foot: Secondary | ICD-10-CM | POA: Diagnosis not present

## 2016-07-17 DIAGNOSIS — J45909 Unspecified asthma, uncomplicated: Secondary | ICD-10-CM | POA: Diagnosis not present

## 2016-07-17 DIAGNOSIS — Y999 Unspecified external cause status: Secondary | ICD-10-CM | POA: Insufficient documentation

## 2016-07-17 DIAGNOSIS — S6991XA Unspecified injury of right wrist, hand and finger(s), initial encounter: Secondary | ICD-10-CM | POA: Diagnosis not present

## 2016-07-17 DIAGNOSIS — W109XXA Fall (on) (from) unspecified stairs and steps, initial encounter: Secondary | ICD-10-CM | POA: Diagnosis not present

## 2016-07-17 DIAGNOSIS — M791 Myalgia: Secondary | ICD-10-CM | POA: Insufficient documentation

## 2016-07-17 DIAGNOSIS — S99911A Unspecified injury of right ankle, initial encounter: Secondary | ICD-10-CM | POA: Diagnosis not present

## 2016-07-17 DIAGNOSIS — M79641 Pain in right hand: Secondary | ICD-10-CM | POA: Diagnosis not present

## 2016-07-17 DIAGNOSIS — M25561 Pain in right knee: Secondary | ICD-10-CM | POA: Diagnosis not present

## 2016-07-17 DIAGNOSIS — M79651 Pain in right thigh: Secondary | ICD-10-CM | POA: Diagnosis not present

## 2016-07-17 MED ORDER — METHOCARBAMOL 500 MG PO TABS: 500.0000 mg | ORAL_TABLET | Freq: Two times a day (BID) | ORAL | 0 refills | Status: DC

## 2016-07-17 MED ORDER — METHOCARBAMOL 500 MG PO TABS
1000.0000 mg | ORAL_TABLET | Freq: Once | ORAL | Status: AC
  Administered 2016-07-17: 1000 mg via ORAL
  Filled 2016-07-17: qty 2

## 2016-07-17 MED ORDER — KETOROLAC TROMETHAMINE 60 MG/2ML IM SOLN
60.0000 mg | Freq: Once | INTRAMUSCULAR | Status: AC
  Administered 2016-07-17: 60 mg via INTRAMUSCULAR
  Filled 2016-07-17: qty 2

## 2016-07-17 MED ORDER — LIDOCAINE 5 % EX PTCH: 1.0000 | MEDICATED_PATCH | CUTANEOUS | 0 refills | Status: DC

## 2016-07-17 NOTE — ED Provider Notes (Signed)
[facility]-EMERGENCY DEPT Provider Note   CSN: [id number] Arrival date & time: 07/17/16  4098  By signing my name below, I, Rosario Adie, attest that this documentation has been prepared under the direction and in the presence of Davita Sublett, PA-C.  Electronically Signed: Rosario Adie, ED Scribe. 07/17/16. 10:08 AM.  History   Chief Complaint Chief Complaint  Patient presents with  . Fall   The history is provided by the patient. No language interpreter was used.    HPI Comments: Miranda Green is a 56 y.o. female who presents to the Emergency Department complaining of sudden onset, persistent right hip, right knee, and left ankle pain s/p mechanical fall which occurred this morning. Per pt, she was walking down a set of stairs this morning when she lost her footing, causing her to fall down onto her right thigh. She also reports that her right leg bent underneath her during the fall and she twisted her left ankle. No LOC or head injury. She rates her current pain as 7/10. Pain is worse with movement of the joints. No treatments for her pain were tried prior to coming into the ED. No prior injuries or surgeries to the injured areas. Denies dizziness, neck/back pain, neuro deficits, chest pain, shortness of breath, nausea/vomiting, abdominal pain, or any other complaints.     Past Medical History:  Diagnosis Date  . ALLERGIC RHINITIS 10/13/2006  . ANEMIA-IRON DEFICIENCY 10/13/2006   resolved after had hysterectomy  . ANXIETY 10/13/2006  . ASTHMA 10/13/2006  . Bleeds easily [facility])    Has had problems when has surgery and birthing  . DEPRESSION 10/13/2006   resolved  . Hernia of abdominal wall   . HYPERLIPIDEMIA 10/13/2006   resolved  . Migraines    Patient Active Problem List   Diagnosis Date Noted  . Acute hearing loss, right 06/19/2016  . Eczema 06/19/2016  . Chest pain 10/07/2014  . Migraine 06/26/2013  . Rash 08/18/2011  . Preventative health care 02/05/2011  .  CERVICAL RADICULOPATHY, LEFT 04/05/2010  . BREAST CYST, RIGHT 03/19/2010  . PALPITATIONS, RECURRENT 03/16/2010  . FREQUENCY, URINARY 03/16/2010  . SHOULDER PAIN, LEFT 11/10/2009  . ELBOW PAIN, RIGHT 11/10/2009  . SUPERFICIAL THROMBOPHLEBITIS 05/04/2007  . HYPERLIPIDEMIA 10/13/2006  . ANEMIA-IRON DEFICIENCY 10/13/2006  . ANXIETY 10/13/2006  . DEPRESSION 10/13/2006  . ALLERGIC RHINITIS 10/13/2006  . ASTHMA 10/13/2006  . COLONIC POLYPS, HX OF 10/13/2006  . OBESITY 10/07/2006   Past Surgical History:  Procedure Laterality Date  . ABDOMINAL HYSTERECTOMY    . CESAREAN SECTION  1993  . NECK SURGERY Left 06/05/2010   release pressure on nerve of left shoulder   OB History    No data available     Home Medications    Prior to Admission medications   Medication Sig Start Date End Date Taking? Authorizing Provider  acetaminophen (TYLENOL) 325 MG tablet Take 650 mg by mouth every 6 (six) hours as needed for moderate pain.   Yes [provider]  clotrimazole (LOTRIMIN) 1 % cream Apply 1 application topically 2 (two) times daily. eczema   Yes [provider]  dicyclomine (BENTYL) 20 MG tablet Take 1 tablet (20 mg total) by mouth 2 (two) times daily. Patient taking differently: Take 20 mg by mouth every 6 (six) hours as needed (pollen).  09/22/15  Yes Garlon Hatchet, PA-C  Multiple Vitamins-Minerals (MULTIVITAMIN WOMEN 50+) TABS Take 1 tablet by mouth daily.   Yes [provider]  aspirin 81  MG EC tablet Take 1 tablet (81 mg total) by mouth daily. Swallow whole. Patient not taking: Reported on 07/17/2016 06/19/16   Corwin Levins, MD  lidocaine (LIDODERM) 5 % Place 1 patch onto the skin daily. Remove & Discard patch within 12 hours or as directed by MD 07/17/16   Sharna Gabrys C, PA-C  methocarbamol (ROBAXIN) 500 MG tablet Take 1 tablet (500 mg total) by mouth 2 (two) times daily. 07/17/16   Gavan Nordby C, PA-C  rosuvastatin (CRESTOR) 20 MG tablet Take 1 tablet (20 mg total)  by mouth daily. Patient not taking: Reported on 07/17/2016 06/19/16   Corwin Levins, MD   Family History Family History  Problem Relation Age of Onset  . Hypertension Other        entire family  . Colon polyps Sister   . Breast cancer Sister   . Diabetes Sister        x 6  . Heart disease Mother   . Diabetes Mother   . Anuerysm Father        brain  . Diabetes Father   . Breast cancer Other   . Diabetes Brother        x 2  . Colon cancer Neg Hx   . Esophageal cancer Neg Hx   . Stomach cancer Neg Hx   . Rectal cancer Neg Hx    Social History Social History  Substance Use Topics  . Smoking status: Never Smoker  . Smokeless tobacco: Never Used  . Alcohol use 0.6 oz/week    1 Glasses of wine per week     Comment: 2-3 per week   Allergies   Aspirin  Review of Systems Review of Systems  Respiratory: Negative for shortness of breath.   Cardiovascular: Negative for chest pain.  Gastrointestinal: Negative for abdominal pain, nausea and vomiting.  Musculoskeletal: Positive for arthralgias and myalgias. Negative for back pain and neck pain.  Skin: Negative for wound.  Neurological: Negative for dizziness, syncope, weakness, light-headedness, numbness and headaches.  All other systems reviewed and are negative.  Physical Exam Updated Vital Signs BP 129/72 (BP Location: Left Arm)   Pulse 61   Temp 98 F (36.7 C) (Oral)   Resp 16   SpO2 100%   Physical Exam  Constitutional: She is oriented to person, place, and time. She appears well-developed and well-nourished. No distress.  HENT:  Head: Normocephalic and atraumatic.  Mouth/Throat: Oropharynx is clear and moist.  Eyes: Conjunctivae and EOM are normal. Pupils are equal, round, and reactive to light.  Neck: Normal range of motion. Neck supple.  Cardiovascular: Normal rate, regular rhythm, normal heart sounds and intact distal pulses.   Pulmonary/Chest: Effort normal and breath sounds normal. No respiratory distress.    Abdominal: Soft. There is no tenderness. There is no guarding.  Musculoskeletal: She exhibits no edema.  Tenderness to the upper right lateral thigh with no deformity noted. Some minor swelling under the skin. Full ROM in the right hip, knee, and ankle. No pain to the right hip or ankle. Some tenderness to the medial right knee. Pain is illicited on the medial right knee with varus stress. No noted swelling, erythema, effusion, crepitus, or deformity.  Some tenderness over the left medial malleolus, no noted swelling, erythema, crepitus, or deformity. ROM fo the left ankle illicits some pain.   Full ROM of the left knee and hip w/o pain.   No midline spinal tenderness, erythema, swelling, crepitus, deformity, or step-off. Full range  of motion in the spine without pain.   Intact distal pulses in all extremities.   Neurological: She is alert and oriented to person, place, and time.  No sensory deficits. Strength 5/5 in all extremities. Antalgic gait. Coordination intact including heel to shin and finger to nose. Cranial nerves III-XII grossly intact. No facial droop.   Skin: Skin is warm and dry. She is not diaphoretic.  Psychiatric: She has a normal mood and affect. Her behavior is normal.  Nursing note and vitals reviewed.  ED Treatments / Results  DIAGNOSTIC STUDIES: Oxygen Saturation is 100% on RA, normal by my interpretation.   COORDINATION OF CARE: 10:07 AM-Discussed next steps with pt including XR right knee and left ankle. Pt verbalized understanding and is agreeable with the plan.   10:09 AM-Offered pt pain management at bedside and she declined.   Labs (all labs ordered are listed, but only abnormal results are displayed) Labs Reviewed - No data to display  EKG  EKG Interpretation None      Radiology Dg Ankle Complete Left  Result Date: 07/17/2016 CLINICAL DATA:  Recent fall with left ankle pain, initial encounter EXAM: LEFT ANKLE COMPLETE - 3+ VIEW COMPARISON:   None. FINDINGS: There is no evidence of fracture, dislocation, or joint effusion. There is no evidence of arthropathy or other focal bone abnormality. Soft tissues are unremarkable. IMPRESSION: No acute abnormality noted. Electronically Signed   By: Alcide Clever M.D.   On: 07/17/2016 11:30   Dg Knee Complete 4 Views Right  Result Date: 07/17/2016 CLINICAL DATA:  Recent fall with knee pain, initial encounter EXAM: RIGHT KNEE - COMPLETE 4+ VIEW COMPARISON:  None. FINDINGS: No evidence of fracture, dislocation, or joint effusion. No evidence of arthropathy or other focal bone abnormality. Soft tissues are unremarkable. IMPRESSION: No acute abnormality noted. Electronically Signed   By: Alcide Clever M.D.   On: 07/17/2016 11:29   Dg Femur Min 2 Views Right  Result Date: 07/17/2016 CLINICAL DATA:  Status post fall.  Lateral tenderness and pain. EXAM: RIGHT FEMUR 2 VIEWS COMPARISON:  None. FINDINGS: There is no evidence of fracture or other focal bone lesions. Soft tissues are unremarkable. IMPRESSION: No acute osseous injury of the right femur. Electronically Signed   By: Elige Ko   On: 07/17/2016 11:24    Procedures Procedures   Medications Ordered in ED Medications  ketorolac (TORADOL) injection 60 mg (60 mg Intramuscular Given 07/17/16 1153)  methocarbamol (ROBAXIN) tablet 1,000 mg (1,000 mg Oral Given 07/17/16 1152)    Initial Impression / Assessment and Plan / ED Course  I have reviewed the triage vital signs and the nursing notes.  Pertinent labs & imaging results that were available during my care of the patient were reviewed by me and considered in my medical decision making (see chart for details).  Clinical Course as of Jul 17 1828  Wed Jul 17, 2016  1230 Patient voices significant improvement in her pain.  [SJ]    Clinical Course User Index [SJ] Nathali Vent C, PA-C    Patient presents following a mechanical fall that occurred just prior to arrival. No acute abnormalities on  x-ray. Patient is ambulatory without assistance. Orthopedic and PCP follow-up. The patient was given instructions for home care as well as return precautions. Patient voices understanding of these instructions, accepts the plan, and is comfortable with discharge.  Patient also given crutches for any necessary support during ambulation.   Findings and plan of care discussed with Adalberto Cole,  MD.    Final Clinical Impressions(s) / ED Diagnoses   Final diagnoses:  Fall, initial encounter   New Prescriptions Discharge Medication List as of 07/17/2016 12:37 PM    START taking these medications   Details  lidocaine (LIDODERM) 5 % Place 1 patch onto the skin daily. Remove & Discard patch within 12 hours or as directed by MD, Starting Wed 07/17/2016, Print    methocarbamol (ROBAXIN) 500 MG tablet Take 1 tablet (500 mg total) by mouth 2 (two) times daily., Starting Wed 07/17/2016, Print       I personally performed the services described in this documentation, which was scribed in my presence. The recorded information has been reviewed and is accurate.    Anselm PancoastJoy, Keylee Shrestha C, PA-C 07/17/16 1831    Gerhard MunchLockwood, Robert, MD 07/18/16 2126

## 2016-07-17 NOTE — Progress Notes (Signed)
Orthopedic Tech Progress Note Patient Details:  Miranda BreathDoris D Green Feb 07, 1961 [id number]  Ortho Devices Type of Ortho Device: ASO, Crutches Ortho Device/Splint Location: lle Ortho Device/Splint Interventions: Application   Kailyn Dubie 07/17/2016, 2:06 PM

## 2016-07-17 NOTE — ED Notes (Signed)
Care handoff to Wendy RN 

## 2016-07-17 NOTE — ED Notes (Signed)
Patient transported to X-ray 

## 2016-07-17 NOTE — ED Triage Notes (Signed)
Pt sts slip and fall down stairs this am; pt sts right ankle pain and left hip pain

## 2016-07-17 NOTE — Progress Notes (Signed)
Orthopedic Tech Progress Note Patient Details:  Miranda BreathDoris D Green 07-20-1960 [id number]  Patient ID: Miranda Green, female   DOB: 07-20-1960, 56 y.o.   MRN: [id number]   Miranda Green, Miranda Green 07/17/2016, 2:07 PM Nursing staff provided knee sleeve

## 2016-07-17 NOTE — Discharge Instructions (Signed)
You have been seen today for a leg and ankle injury. There were no acute abnormalities on the x-rays, including no sign of fracture or dislocation. Expect your soreness to increase over the next 2-3 days. Take it easy, but do not lay around too much as this may make any stiffness worse.  Muscle relaxer: Robaxin is a muscle relaxer and may help loosen stiff muscles. Do not take the Robaxin while driving or performing other dangerous activities.  Lidocaine patches: These are available via either prescription or over-the-counter. The over-the-counter option may be more economical one and are likely just as effective. There are multiple over-the-counter brands, such as Salonpas. Ice: May apply ice to the area over the next 24 hours for 15 minutes at a time to reduce swelling. Elevation: Keep the extremity elevated as often as possible to reduce pain and inflammation. Exercises: Start by performing these exercises a few times a week, increasing the frequency until you are performing them twice daily.  Follow up: If symptoms are improving, you may follow up with your primary care provider for any continued management. If symptoms are not improving, you may follow up with the orthopedic specialist.

## 2016-07-25 DIAGNOSIS — M545 Low back pain: Secondary | ICD-10-CM | POA: Diagnosis not present

## 2016-07-25 DIAGNOSIS — M5441 Lumbago with sciatica, right side: Secondary | ICD-10-CM | POA: Diagnosis not present

## 2016-07-29 DIAGNOSIS — H6123 Impacted cerumen, bilateral: Secondary | ICD-10-CM | POA: Insufficient documentation

## 2016-07-29 DIAGNOSIS — H93291 Other abnormal auditory perceptions, right ear: Secondary | ICD-10-CM | POA: Diagnosis not present

## 2016-07-29 DIAGNOSIS — H93A1 Pulsatile tinnitus, right ear: Secondary | ICD-10-CM | POA: Diagnosis not present

## 2016-07-29 DIAGNOSIS — H6121 Impacted cerumen, right ear: Secondary | ICD-10-CM | POA: Diagnosis not present

## 2016-07-29 DIAGNOSIS — H93A9 Pulsatile tinnitus, unspecified ear: Secondary | ICD-10-CM | POA: Insufficient documentation

## 2016-07-30 ENCOUNTER — Telehealth: Payer: Self-pay | Admitting: Internal Medicine

## 2016-07-30 ENCOUNTER — Other Ambulatory Visit: Payer: Self-pay | Admitting: Orthopedic Surgery

## 2016-07-30 DIAGNOSIS — M5441 Lumbago with sciatica, right side: Secondary | ICD-10-CM

## 2016-07-30 NOTE — Telephone Encounter (Signed)
unfortunately I dont see eczema in her hx; she would need OV if she has a rash

## 2016-07-30 NOTE — Telephone Encounter (Signed)
Pt called in and said her eczema is really bad and needs to know if Dr Jonny Ruizjohn can call something in.  She said that she is needing something strong

## 2016-07-31 NOTE — Telephone Encounter (Signed)
LVM notifying Pt.

## 2016-07-31 NOTE — Telephone Encounter (Signed)
Same answer , thanks 

## 2016-07-31 NOTE — Telephone Encounter (Signed)
Pt states Eczema is in her chart and she would like to know if she HAS to come in or if something can be prescribed.

## 2016-08-01 ENCOUNTER — Encounter: Payer: Self-pay | Admitting: Internal Medicine

## 2016-08-01 ENCOUNTER — Ambulatory Visit (INDEPENDENT_AMBULATORY_CARE_PROVIDER_SITE_OTHER): Payer: Federal, State, Local not specified - PPO | Admitting: Internal Medicine

## 2016-08-01 VITALS — BP 124/86 | HR 88 | Ht 65.0 in | Wt 175.0 lb

## 2016-08-01 DIAGNOSIS — E669 Obesity, unspecified: Secondary | ICD-10-CM | POA: Diagnosis not present

## 2016-08-01 DIAGNOSIS — Z6830 Body mass index (BMI) 30.0-30.9, adult: Secondary | ICD-10-CM | POA: Diagnosis not present

## 2016-08-01 DIAGNOSIS — R232 Flushing: Secondary | ICD-10-CM | POA: Diagnosis not present

## 2016-08-01 DIAGNOSIS — L309 Dermatitis, unspecified: Secondary | ICD-10-CM | POA: Diagnosis not present

## 2016-08-01 DIAGNOSIS — E785 Hyperlipidemia, unspecified: Secondary | ICD-10-CM

## 2016-08-01 MED ORDER — TRIAMCINOLONE ACETONIDE 0.1 % EX CREA: 1.0000 "application " | TOPICAL_CREAM | Freq: Two times a day (BID) | CUTANEOUS | 0 refills | Status: AC

## 2016-08-01 MED ORDER — PHENTERMINE HCL 30 MG PO CAPS: 30.0000 mg | ORAL_CAPSULE | ORAL | 2 refills | Status: DC

## 2016-08-01 MED ORDER — ESCITALOPRAM OXALATE 10 MG PO TABS: 10.0000 mg | ORAL_TABLET | Freq: Every day | ORAL | 3 refills | Status: DC

## 2016-08-01 NOTE — Progress Notes (Signed)
Subjective:    Patient ID: Miranda Green, female    DOB: Jun 20, 1960, 56 y.o.   MRN: [id number]  HPI    Here with c/o 2 wks worseing nonpainful scaly eczema like rash to bilat neck areas, just cant stop scratching, without pain, fever, swelling or other rash;  Also now menopausal with frequent Hot flashes, asks for tx but does not want to consider HRT.  Also trying to lose wt, but has tried many diet and increased activity, and nothing seems to help.  Denies hyper or hypo thyroid symptoms such as voice, skin or hair change.  Pt denies chest pain, increased sob or doe, wheezing, orthopnea, PND, increased LE swelling, palpitations, dizziness or syncope.   Pt denies polydipsia, polyuria  Has allergy to nickel but has not worn necklace recently Past Medical History:  Diagnosis Date  . ALLERGIC RHINITIS 10/13/2006  . ANEMIA-IRON DEFICIENCY 10/13/2006   resolved after had hysterectomy  . ANXIETY 10/13/2006  . ASTHMA 10/13/2006  . Bleeds easily [facility])    Has had problems when has surgery and birthing  . DEPRESSION 10/13/2006   resolved  . Hernia of abdominal wall   . HYPERLIPIDEMIA 10/13/2006   resolved  . Migraines    Past Surgical History:  Procedure Laterality Date  . ABDOMINAL HYSTERECTOMY    . CESAREAN SECTION  1993  . NECK SURGERY Left 06/05/2010   release pressure on nerve of left shoulder    reports that she has never smoked. She has never used smokeless tobacco. She reports that she drinks about 0.6 oz of alcohol per week . She reports that she does not use drugs. family history includes Anuerysm in her father; Breast cancer in her other and sister; Colon polyps in her sister; Diabetes in her brother, father, mother, and sister; Heart disease in her mother; Hypertension in her other. Allergies  Allergen Reactions  . Aspirin     REACTION: stomach upset   Current Outpatient Prescriptions on File Prior to Visit  Medication Sig Dispense Refill  . acetaminophen (TYLENOL) 325 MG tablet  Take 650 mg by mouth every 6 (six) hours as needed for moderate pain.    Marland Kitchen aspirin 81 MG EC tablet Take 1 tablet (81 mg total) by mouth daily. Swallow whole. 30 tablet 12  . dicyclomine (BENTYL) 20 MG tablet Take 1 tablet (20 mg total) by mouth 2 (two) times daily. (Patient taking differently: Take 20 mg by mouth every 6 (six) hours as needed (pollen). ) 20 tablet 0  . lidocaine (LIDODERM) 5 % Place 1 patch onto the skin daily. Remove & Discard patch within 12 hours or as directed by MD 30 patch 0  . methocarbamol (ROBAXIN) 500 MG tablet Take 1 tablet (500 mg total) by mouth 2 (two) times daily. 20 tablet 0  . Multiple Vitamins-Minerals (MULTIVITAMIN WOMEN 50+) TABS Take 1 tablet by mouth daily.    . rosuvastatin (CRESTOR) 20 MG tablet Take 1 tablet (20 mg total) by mouth daily. 90 tablet 3   No current facility-administered medications on file prior to visit.    Review of Systems  Constitutional: Negative for other unusual diaphoresis or sweats HENT: Negative for ear discharge or swelling Eyes: Negative for other worsening visual disturbances Respiratory: Negative for stridor or other swelling  Gastrointestinal: Negative for worsening distension or other blood Genitourinary: Negative for retention or other urinary change Musculoskeletal: Negative for other MSK pain or swelling Skin: Negative for color change or other new lesions Neurological: Negative for  worsening tremors and other numbness  Psychiatric/Behavioral: Negative for worsening agitation or other fatigue All other system neg per pt    Objective:   Physical Exam BP 124/86   Pulse 88   Ht 5\' 5"  (1.651 m)   Wt 175 lb (79.4 kg)   SpO2 99%   BMI 29.12 kg/m  VS noted,  Constitutional: Pt appears in NAD HENT: Head: NCAT.  Right Ear: External ear normal.  Left Ear: External ear normal.  Eyes: . Pupils are equal, round, and reactive to light. Conjunctivae and EOM are normal Nose: without d/c or deformity Neck: Neck supple.  Gross normal ROM Cardiovascular: Normal rate and regular rhythm.   Pulmonary/Chest: Effort normal and Green sounds without rales or wheezing.  Neurological: Pt is alert. At baseline orientation, motor grossly intact Skin: Skin is warm. + silvery scaly erythem nontender rashes to bilat neck, other new lesions, no LE edema Psychiatric: Pt behavior is normal without agitation  No other exam findings Lab Results  Component Value Date   WBC 4.9 06/19/2016   HGB 13.1 06/19/2016   HCT 39.4 06/19/2016   PLT 244.0 06/19/2016   GLUCOSE 76 06/19/2016   CHOL 236 (H) 06/19/2016   TRIG 107.0 06/19/2016   HDL 42.40 06/19/2016   LDLCALC 172 (H) 06/19/2016   ALT 33 06/19/2016   AST 22 06/19/2016   NA 140 06/19/2016   K 3.9 06/19/2016   CL 104 06/19/2016   CREATININE 0.78 06/19/2016   BUN 10 06/19/2016   CO2 29 06/19/2016   TSH 1.07 06/19/2016       Assessment & Plan:

## 2016-08-01 NOTE — Patient Instructions (Signed)
Please take all new medication as prescribed- the steroid cream, lexapro for hot flashes, and the weight loss medication  Please continue all other medications as before, and refills have been done if requested.  Please have the pharmacy call with any other refills you may need.  Please continue your efforts at being more active, low cholesterol diet, and weight control.  Please keep your appointments with your specialists as you may have planned

## 2016-08-04 DIAGNOSIS — R232 Flushing: Secondary | ICD-10-CM | POA: Insufficient documentation

## 2016-08-04 NOTE — Assessment & Plan Note (Signed)
Mild, for phentermine asd, lower calorie diet and increased activity

## 2016-08-04 NOTE — Assessment & Plan Note (Signed)
Post menopausal, for lexapro 10 qd,  to f/u any worsening symptoms or concerns

## 2016-08-04 NOTE — Assessment & Plan Note (Signed)
Mild to mod, for steroid cream prn,  to f/u any worsening symptoms or concerns 

## 2016-08-04 NOTE — Assessment & Plan Note (Signed)
Severe, declines statin, for lower chol diet, f/u next visit

## 2016-08-07 DIAGNOSIS — K080 Exfoliation of teeth due to systemic causes: Secondary | ICD-10-CM | POA: Diagnosis not present

## 2016-08-13 ENCOUNTER — Ambulatory Visit: Payer: Federal, State, Local not specified - PPO

## 2016-08-13 DIAGNOSIS — M5441 Lumbago with sciatica, right side: Secondary | ICD-10-CM | POA: Diagnosis not present

## 2016-08-14 ENCOUNTER — Ambulatory Visit: Payer: Federal, State, Local not specified - PPO

## 2016-08-15 ENCOUNTER — Ambulatory Visit: Payer: Federal, State, Local not specified - PPO

## 2016-08-15 ENCOUNTER — Ambulatory Visit
Admission: RE | Admit: 2016-08-15 | Discharge: 2016-08-15 | Disposition: A | Discharge status: Home / Self Care | Payer: Federal, State, Local not specified - PPO | Source: Ambulatory Visit | Attending: Orthopedic Surgery | Admitting: Orthopedic Surgery

## 2016-08-15 DIAGNOSIS — M5441 Lumbago with sciatica, right side: Secondary | ICD-10-CM

## 2016-08-15 DIAGNOSIS — M545 Low back pain: Secondary | ICD-10-CM | POA: Diagnosis not present

## 2016-08-20 ENCOUNTER — Other Ambulatory Visit: Payer: Federal, State, Local not specified - PPO

## 2016-08-22 ENCOUNTER — Other Ambulatory Visit: Payer: Federal, State, Local not specified - PPO

## 2016-08-23 ENCOUNTER — Ambulatory Visit
Admission: RE | Admit: 2016-08-23 | Discharge: 2016-08-23 | Disposition: A | Discharge status: Home / Self Care | Payer: Federal, State, Local not specified - PPO | Source: Ambulatory Visit | Attending: Internal Medicine | Admitting: Internal Medicine

## 2016-08-23 DIAGNOSIS — Z1231 Encounter for screening mammogram for malignant neoplasm of breast: Secondary | ICD-10-CM | POA: Diagnosis not present

## 2016-12-18 DIAGNOSIS — K080 Exfoliation of teeth due to systemic causes: Secondary | ICD-10-CM | POA: Diagnosis not present

## 2017-03-18 ENCOUNTER — Telehealth: Payer: Self-pay | Admitting: Internal Medicine

## 2017-03-18 DIAGNOSIS — Z8249 Family history of ischemic heart disease and other diseases of the circulatory system: Secondary | ICD-10-CM

## 2017-03-18 NOTE — Telephone Encounter (Signed)
Copied from CRM [id number]. Topic: General - Other >> Mar 18, 2017  1:01 PM Gerrianne ScalePayne, Rickita Forstner L wrote: Reason for CRM: patient was just concerned because she has had some swelling in her hands off/on for 1 month and swelling on her eyes she has already asked for a referral to a Cardiologist she was just worried about the swelling

## 2017-03-18 NOTE — Addendum Note (Signed)
Addended by: [name], Kesley Gaffey W on: 03/18/2017 02:24 PM   Modules accepted: Orders

## 2017-03-18 NOTE — Telephone Encounter (Signed)
Ok, this is done 

## 2017-03-18 NOTE — Telephone Encounter (Signed)
Copied from CRM [phone]. Topic: Referral - Request >> Mar 18, 2017 12:56 PM Gerrianne ScalePayne, Angela L wrote: Reason for CRM: patient would like to go to a cardiologist because heart issues run in her family and that sometimes she has swelling in her hands feeling faint and just worried and a co worker had a heart attack yesterday and it scared her

## 2017-03-24 DIAGNOSIS — K080 Exfoliation of teeth due to systemic causes: Secondary | ICD-10-CM | POA: Diagnosis not present

## 2017-04-24 ENCOUNTER — Emergency Department (HOSPITAL_COMMUNITY)
Admission: EM | Admit: 2017-04-24 | Discharge: 2017-04-24 | Disposition: A | Discharge status: Home / Self Care | Payer: Federal, State, Local not specified - PPO | Attending: Physician Assistant | Admitting: Physician Assistant

## 2017-04-24 ENCOUNTER — Emergency Department (HOSPITAL_COMMUNITY): Payer: Federal, State, Local not specified - PPO

## 2017-04-24 ENCOUNTER — Encounter (HOSPITAL_COMMUNITY): Payer: Self-pay

## 2017-04-24 DIAGNOSIS — Z79899 Other long term (current) drug therapy: Secondary | ICD-10-CM | POA: Insufficient documentation

## 2017-04-24 DIAGNOSIS — M549 Dorsalgia, unspecified: Secondary | ICD-10-CM | POA: Diagnosis not present

## 2017-04-24 DIAGNOSIS — M545 Low back pain: Secondary | ICD-10-CM | POA: Insufficient documentation

## 2017-04-24 DIAGNOSIS — M542 Cervicalgia: Secondary | ICD-10-CM | POA: Insufficient documentation

## 2017-04-24 DIAGNOSIS — T148XXA Other injury of unspecified body region, initial encounter: Secondary | ICD-10-CM | POA: Diagnosis not present

## 2017-04-24 DIAGNOSIS — R51 Headache: Secondary | ICD-10-CM | POA: Insufficient documentation

## 2017-04-24 DIAGNOSIS — S199XXA Unspecified injury of neck, initial encounter: Secondary | ICD-10-CM | POA: Diagnosis not present

## 2017-04-24 DIAGNOSIS — J45909 Unspecified asthma, uncomplicated: Secondary | ICD-10-CM | POA: Diagnosis not present

## 2017-04-24 DIAGNOSIS — Z7982 Long term (current) use of aspirin: Secondary | ICD-10-CM | POA: Insufficient documentation

## 2017-04-24 DIAGNOSIS — M546 Pain in thoracic spine: Secondary | ICD-10-CM | POA: Diagnosis not present

## 2017-04-24 DIAGNOSIS — S0990XA Unspecified injury of head, initial encounter: Secondary | ICD-10-CM | POA: Diagnosis not present

## 2017-04-24 MED ORDER — CYCLOBENZAPRINE HCL 10 MG PO TABS: 10.0000 mg | ORAL_TABLET | Freq: Two times a day (BID) | ORAL | 0 refills | Status: DC | PRN

## 2017-04-24 MED ORDER — CYCLOBENZAPRINE HCL 10 MG PO TABS
10.0000 mg | ORAL_TABLET | Freq: Once | ORAL | Status: AC
  Administered 2017-04-24: 10 mg via ORAL
  Filled 2017-04-24: qty 1
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
## 2017-04-24 NOTE — ED Notes (Signed)
Patient not currently in room at this time.  
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
## 2017-04-25 ENCOUNTER — Encounter: Payer: Self-pay | Admitting: Cardiovascular Disease

## 2017-04-25 ENCOUNTER — Ambulatory Visit: Payer: Federal, State, Local not specified - PPO | Admitting: Cardiovascular Disease

## 2017-04-25 ENCOUNTER — Ambulatory Visit: Payer: Federal, State, Local not specified - PPO | Admitting: Cardiology

## 2017-04-25 VITALS — BP 128/88 | HR 74 | Ht 65.0 in | Wt 174.8 lb

## 2017-04-25 DIAGNOSIS — R079 Chest pain, unspecified: Secondary | ICD-10-CM | POA: Diagnosis not present
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
## 2017-04-25 NOTE — Patient Instructions (Addendum)
Medication Instructions:  Your physician recommends that you continue on your current medications as directed. Please refer to the Current Medication list given to you today.  Labwork: NONE  Testing/Procedures: Your physician has requested that you have an exercise tolerance test. For further information please visit [email or web address]. Please also follow instruction sheet, as given.   CT for calcium score , (CAT scanning), is a noninvasive, special x-ray that produces cross-sectional images of the body using x-rays and a computer. CT scans help physicians diagnose and treat medical conditions. For some CT exams, a contrast material is used to enhance visibility in the area of the body being studied. CT scans provide greater clarity and reveal more details than regular x-ray exams.    Follow-Up: Your physician wants you to follow-up as needed with Dr. Eden EmmsNishan.    If you need a refill on your cardiac medications before your next appointment, please call your pharmacy.

## 2017-04-28 ENCOUNTER — Ambulatory Visit: Payer: Federal, State, Local not specified - PPO | Admitting: Cardiovascular Disease

## 2017-04-30 ENCOUNTER — Ambulatory Visit: Payer: Federal, State, Local not specified - PPO | Admitting: Internal Medicine

## 2017-04-30 ENCOUNTER — Encounter: Payer: Self-pay | Admitting: Internal Medicine

## 2017-04-30 VITALS — BP 128/66 | HR 74 | Temp 98.7°F | Resp 16 | Wt 176.0 lb

## 2017-04-30 DIAGNOSIS — S060X0A Concussion without loss of consciousness, initial encounter: Secondary | ICD-10-CM

## 2017-04-30 DIAGNOSIS — K080 Exfoliation of teeth due to systemic causes: Secondary | ICD-10-CM | POA: Diagnosis not present

## 2017-04-30 DIAGNOSIS — M542 Cervicalgia: Secondary | ICD-10-CM
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
## 2017-04-30 NOTE — Patient Instructions (Addendum)
Make an appointment with Dr Katrinka Blazing for evaluation for your neck pain and concussion.     Stay out of work for now.  Continue the flexeril for your neck pain.  Avoid stimulation as we discussed.    Concussion, Adult A concussion is a brain injury from a direct hit (blow) to the head or body. This blow causes the brain to shake quickly back and forth inside the skull. This can damage brain cells and cause chemical changes in the brain. A concussion may also be known as a mild traumatic brain injury (TBI). Concussions are usually not life-threatening, but the effects of a concussion can be serious. If you have a concussion, you are more likely to experience concussion-like symptoms after a direct blow to the head in the future. What are the causes? This condition is caused by:  A direct blow to the head, such as from running into another player during a game, being hit in a fight, or hitting your head on a hard surface.  A jolt of the head or neck that causes the brain to move back and forth inside the skull, such as in a car crash.  What are the signs or symptoms? The signs of a concussion can be hard to notice. Early on, they may be missed by you, family members, and health care providers. You may look fine but act or feel differently. Symptoms are usually temporary, but they may last for days, weeks, or even longer. Some symptoms may appear right away but other symptoms may not show up for hours or days. Every head injury is different. Symptoms may include:  Headaches. This can include a feeling of pressure in the head.  Memory problems.  Trouble concentrating, organizing, or making decisions.  Slowness in thinking, acting or reacting, speaking, or reading.  Confusion.  Fatigue.  Changes in eating or sleeping patterns.  Problems with coordination or balance.  Nausea or vomiting.  Numbness or tingling.  Sensitivity to light or noise.  Vision or hearing problems.  Reduced  sense of smell.  Irritability or mood changes.  Dizziness.  Lack of motivation.  Seeing or hearing things that other people do not see or hear (hallucinations).  How is this diagnosed? This condition is diagnosed based on:  Your symptoms.  A description of your injury.  You may also have tests, including:  Imaging tests, such as a CT scan or MRI. These are done to look for signs of brain injury.  Neuropsychological tests. These measure your thinking, understanding, learning, and remembering abilities.  How is this treated? This condition is treated with physical and mental rest and careful observation, usually at home. If the concussion is severe, you may need to stay home from work for a while. You may be referred to a concussion clinic or to other health care providers for management. It is important that you tell your health care provider if:  You are taking any medicines, including prescription medicines, over-the-counter medicines, and natural remedies. Some medicines, such as blood thinners (anticoagulants) and aspirin, may increase the chance of complications, such as bleeding.  You are taking or have taken alcohol or illegal drugs. Alcohol and certain other drugs may slow your recovery and can put you at risk of further injury.  How fast you will recover from a concussion depends on many factors, such as how severe your concussion is, what part of your brain was injured, how old you are, and how healthy you were before the concussion.  Recovery can take time. It is important to wait to return to activity until a health care provider says it is safe to do that and your symptoms are completely gone. Follow these instructions at home: Activity  Limit activities that require a lot of thought or concentration. These may include: ? Doing homework or job-related work. ? Watching TV. ? Working on the computer. ? Playing memory games and puzzles.  Rest. Rest helps the brain to  heal. Make sure you: ? Get plenty of sleep at night. Avoid staying up late at night. ? Keep the same bedtime hours on weekends and weekdays. ? Rest during the day. Take naps or rest breaks when you feel tired.  Having another concussion before the first one has healed can be dangerous. Do not do high-risk activities that could cause a second concussion, such as riding a bicycle or playing sports.  Ask your health care provider when you can return to your normal activities, such as school, work, athletics, driving, riding a bicycle, or using heavy machinery. Your ability to react may be slower after a brain injury. Never do these activities if you are dizzy. Your health care provider will likely give you a plan for gradually returning to activities. General instructions  Take over-the-counter and prescription medicines only as told by your health care provider.  Do not drink alcohol until your health care provider says you can.  If it is harder than usual to remember things, write them down.  If you are easily distracted, try to do one thing at a time. For example, do not try to watch TV while fixing dinner.  Talk with family members or close friends when making important decisions.  Watch your symptoms and tell others to do the same. Complications sometimes occur after a concussion. Older adults with a brain injury may have a higher risk of serious complications, such as a blood clot in the brain.  Tell your teachers, school nurse, school counselor, coach, athletic trainer, or work Production designer, theatre/television/film about your injury, symptoms, and restrictions. Tell them about what you can or cannot do. They should watch for: ? Increased problems with attention or concentration. ? Increased difficulty remembering or learning new information. ? Increased time needed to complete tasks or assignments. ? Increased irritability or decreased ability to cope with stress. ? Increased symptoms.  Keep all follow-up visits  as told by your health care provider. This is important. How is this prevented? It is very important to avoid another brain injury, especially as you recover. In rare cases, another injury can lead to permanent brain damage, brain swelling, or death. The risk of this is greatest during the first 7-10 days after a head injury. Avoid injuries by:  Wearing a seat belt when riding in a car.  Wearing a helmet when biking, skiing, skateboarding, skating, or doing similar activities.  Avoiding activities that could lead to a second concussion, such as contact or recreational sports, until your health care provider says it is okay.  Taking safety measures in your home, such as: ? Removing clutter and tripping hazards from floors and stairways. ? Using grab bars in bathrooms and handrails by stairs. ? Placing non-slip mats on floors and in bathtubs. ? Improving lighting in dim areas.  Contact a health care provider if:  Your symptoms get worse.  You have new symptoms.  You continue to have symptoms for more than 2 weeks. Get help right away if:  You have severe or worsening headaches.  You have weakness or numbness in any part of your body.  Your coordination gets worse.  You vomit repeatedly.  You are sleepier.  The pupil of one eye is larger than the other.  You have convulsions or a seizure.  Your speech is slurred.  Your fatigue, confusion, or irritability gets worse.  You cannot recognize people or places.  You have neck pain.  It is difficult to wake you up.  You have unusual behavior changes.  You lose consciousness. Summary  A concussion is a brain injury from a direct hit (blow) to the head or body.  A concussion may also be called a mild traumatic brain injury (TBI).  You may have imaging tests and neuropsychological tests to diagnose a concussion.  This condition is treated with physical and mental rest and careful observation.  Ask your health care  provider when you can return to your normal activities, such as school, work, athletics, driving, riding a bicycle, or using heavy machinery. Follow safety instructions as told by your health care provider. This information is not intended to replace advice given to you by your health care provider. Make sure you discuss any questions you have with your health care provider. Document Released: 04/27/2003 Document Revised: 01/16/2016 Document Reviewed: 01/16/2016 Elsevier Interactive Patient Education  [employer]2018 [employer]
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
## 2017-05-01 ENCOUNTER — Ambulatory Visit (INDEPENDENT_AMBULATORY_CARE_PROVIDER_SITE_OTHER): Payer: Federal, State, Local not specified - PPO | Admitting: Family Medicine

## 2017-05-01 ENCOUNTER — Encounter: Payer: Self-pay | Admitting: Family Medicine

## 2017-05-01 DIAGNOSIS — S060X0A Concussion without loss of consciousness, initial encounter: Secondary | ICD-10-CM | POA: Diagnosis not present

## 2017-05-01 NOTE — Patient Instructions (Signed)
Good to see you  You do have a concussion.  Fish oil 3 grams daily  Vitamin D 2000 IU daily  CoQ10 400mg  daily  Tart cherry extract any dose at night Ibuprofen if you need for the neck.  See me again on Monday

## 2017-05-01 NOTE — Assessment & Plan Note (Signed)
Likely concussion noted.  Discussed over-the-counter medications, hold out of work for the next 96 hours.  Follow-up on Monday and retest.  Encourage patient to try harder on that.  Encourage patient try not to do the muscle relaxer or the pain medications if possible.  Follow-up again Monday

## 2017-05-04 ENCOUNTER — Emergency Department (HOSPITAL_COMMUNITY): Payer: Federal, State, Local not specified - PPO

## 2017-05-04 ENCOUNTER — Emergency Department (HOSPITAL_COMMUNITY)
Admission: EM | Admit: 2017-05-04 | Discharge: 2017-05-04 | Disposition: A | Discharge status: Home / Self Care | Payer: Federal, State, Local not specified - PPO | Attending: Emergency Medicine | Admitting: Emergency Medicine

## 2017-05-04 ENCOUNTER — Encounter (HOSPITAL_COMMUNITY): Payer: Self-pay | Admitting: *Deleted

## 2017-05-04 DIAGNOSIS — Z87820 Personal history of traumatic brain injury: Secondary | ICD-10-CM | POA: Insufficient documentation

## 2017-05-04 DIAGNOSIS — J45909 Unspecified asthma, uncomplicated: Secondary | ICD-10-CM | POA: Insufficient documentation

## 2017-05-04 DIAGNOSIS — R29810 Facial weakness: Secondary | ICD-10-CM | POA: Insufficient documentation

## 2017-05-04 DIAGNOSIS — R531 Weakness: Secondary | ICD-10-CM | POA: Diagnosis not present

## 2017-05-04 DIAGNOSIS — F449 Dissociative and conversion disorder, unspecified: Secondary | ICD-10-CM | POA: Diagnosis not present

## 2017-05-04 DIAGNOSIS — R29818 Other symptoms and signs involving the nervous system: Secondary | ICD-10-CM | POA: Diagnosis not present

## 2017-05-04 DIAGNOSIS — Z79899 Other long term (current) drug therapy: Secondary | ICD-10-CM | POA: Diagnosis not present

## 2017-05-04 DIAGNOSIS — I639 Cerebral infarction, unspecified: Secondary | ICD-10-CM | POA: Diagnosis not present

## 2017-05-04 LAB — COMPREHENSIVE METABOLIC PANEL
ALT: 18 U/L (ref 14–54)
AST: 21 U/L (ref 15–41)
Albumin: 4 g/dL (ref 3.5–5.0)
Alkaline Phosphatase: 39 U/L (ref 38–126)
Anion gap: 9 (ref 5–15)
BUN: 14 mg/dL (ref 6–20)
CO2: 22 mmol/L (ref 22–32)
Calcium: 9.1 mg/dL (ref 8.9–10.3)
Chloride: 108 mmol/L (ref 101–111)
Creatinine, Ser: 0.7 mg/dL (ref 0.44–1.00)
GFR calc Af Amer: >60 mL/min (ref >60)
GFR calc non Af Amer: >60 mL/min (ref >60)
Glucose, Bld: 89 mg/dL (ref 65–99)
Potassium: 3.8 mmol/L (ref 3.5–5.1)
Sodium: 139 mmol/L (ref 135–145)
Total Bilirubin: 0.5 mg/dL (ref 0.3–1.2)
Total Protein: 7.2 g/dL (ref 6.5–8.1)

## 2017-05-04 LAB — HEMOGLOBIN A1C
HEMOGLOBIN A1C: 5.2 % (ref 4.8–5.6)
MEAN PLASMA GLUCOSE: 102.54 mg/dL

## 2017-05-04 LAB — DIFFERENTIAL
Basophils Absolute: 0 10*3/uL (ref 0.0–0.1)
Basophils Relative: 1 %
Eosinophils Absolute: 0.2 10*3/uL (ref 0.0–0.7)
Eosinophils Relative: 6 %
Lymphocytes Relative: 30 %
Lymphs Abs: 1.2 10*3/uL (ref 0.7–4.0)
Monocytes Absolute: 0.4 10*3/uL (ref 0.1–1.0)
Monocytes Relative: 9 %
Neutro Abs: 2.2 10*3/uL (ref 1.7–7.7)
Neutrophils Relative %: 54 %

## 2017-05-04 LAB — I-STAT CHEM 8, ED
BUN: 15 mg/dL (ref 6–20)
Calcium, Ion: 1.14 mmol/L — ABNORMAL LOW (ref 1.15–1.40)
Chloride: 107 mmol/L (ref 101–111)
Creatinine, Ser: 0.6 mg/dL (ref 0.44–1.00)
Glucose, Bld: 89 mg/dL (ref 65–99)
HCT: 39 % (ref 36.0–46.0)
Hemoglobin: 13.3 g/dL (ref 12.0–15.0)
Potassium: 3.8 mmol/L (ref 3.5–5.1)
Sodium: 141 mmol/L (ref 135–145)
TCO2: 23 mmol/L (ref 22–32)

## 2017-05-04 LAB — CBC
HCT: 38 % (ref 36.0–46.0)
Hemoglobin: 12.2 g/dL (ref 12.0–15.0)
MCH: 27.9 pg (ref 26.0–34.0)
MCHC: 32.1 g/dL (ref 30.0–36.0)
MCV: 87 fL (ref 78.0–100.0)
Platelets: 220 10*3/uL (ref 150–400)
RBC: 4.37 MIL/uL (ref 3.87–5.11)
RDW: 13.9 % (ref 11.5–15.5)
WBC: 3.9 10*3/uL — ABNORMAL LOW (ref 4.0–10.5)

## 2017-05-04 LAB — PROTIME-INR
INR: 0.94
Prothrombin Time: 12.5 seconds (ref 11.4–15.2)

## 2017-05-04 LAB — I-STAT TROPONIN, ED: Troponin i, poc: 0 ng/mL (ref 0.00–0.08)

## 2017-05-04 LAB — I-STAT BETA HCG BLOOD, ED (MC, WL, AP ONLY): I-stat hCG, quantitative: <5 m[IU]/mL (ref <5)

## 2017-05-04 LAB — APTT: aPTT: 36 seconds (ref 24–36)

## 2017-05-04 MED ORDER — IOPAMIDOL (ISOVUE-370) INJECTION 76%
INTRAVENOUS | Status: AC
  Administered 2017-05-04: 50 mL
  Filled 2017-05-04: qty 50

## 2017-05-04 MED ORDER — ASPIRIN EC 81 MG PO TBEC: 81.0000 mg | DELAYED_RELEASE_TABLET | Freq: Every day | ORAL | Status: DC

## 2017-05-04 MED ORDER — ATORVASTATIN CALCIUM 20 MG PO TABS: 20.0000 mg | ORAL_TABLET | Freq: Every day | ORAL | Status: DC

## 2017-05-04 MED ORDER — LORAZEPAM 2 MG/ML IJ SOLN
INTRAMUSCULAR | Status: AC
  Administered 2017-05-04: 1 mg
  Filled 2017-05-04: qty 1
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
## 2017-05-04 NOTE — ED Notes (Signed)
Husband in CT talking with Dr. Otelia LimesLindzen.

## 2017-05-04 NOTE — ED Triage Notes (Addendum)
LSN 1130, Pt endorses sudden onset of right sided facial droop, right side weakness while sitting in church. Pt assessed by abigail PA in triage and code stroke activated. Speech is clear, drift noted to right arm and leg.
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
## 2017-05-04 NOTE — ED Notes (Signed)
Pt able to dress self.  Ambulated from chair to wheelchair with no difficulties.

## 2017-05-04 NOTE — ED Provider Notes (Signed)
[facility]   CSN: [id number] Arrival date & time: 05/04/17  1201   An emergency department physician performed an initial assessment on this suspected stroke patient at 1212.  History   Chief Complaint Chief Complaint  Patient presents with  . Facial Droop    HPI Miranda Green is a 57 y.o. female.  HPI   57 year old female presenting with a right facial droop.  Onset around 11:30 AM.  She was at church when her symptoms began.  Right-sided weakness and numbness.  She has made a "code stroke" upon arrival to the hospital.    Past Medical History:  Diagnosis Date  . ALLERGIC RHINITIS 10/13/2006  . ANEMIA-IRON DEFICIENCY 10/13/2006   resolved after had hysterectomy  . ANXIETY 10/13/2006  . ASTHMA 10/13/2006  . Bleeds easily [facility])    Has had problems when has surgery and birthing  . DEPRESSION 10/13/2006   resolved  . Hernia of abdominal wall   . HYPERLIPIDEMIA 10/13/2006   resolved  . Migraines     Patient Active Problem List   Diagnosis Date Noted  . Concussion with no loss of consciousness 04/30/2017  . Neck pain 04/30/2017  . Hot flashes 08/04/2016  . Acute hearing loss, right 06/19/2016  . Eczema 06/19/2016  . Chest pain 10/07/2014  . Migraine 06/26/2013  . Rash 08/18/2011  . Preventative health care 02/05/2011  . CERVICAL RADICULOPATHY, LEFT 04/05/2010  . BREAST CYST, RIGHT 03/19/2010  . PALPITATIONS, RECURRENT 03/16/2010  . FREQUENCY, URINARY 03/16/2010  . SHOULDER PAIN, LEFT 11/10/2009  . ELBOW PAIN, RIGHT 11/10/2009  . SUPERFICIAL THROMBOPHLEBITIS 05/04/2007  . Hyperlipidemia 10/13/2006  . ANEMIA-IRON DEFICIENCY 10/13/2006  . ANXIETY 10/13/2006  . DEPRESSION 10/13/2006  . ALLERGIC RHINITIS 10/13/2006  . ASTHMA 10/13/2006  . COLONIC POLYPS, HX OF 10/13/2006  . OBESITY 10/07/2006    Past Surgical History:  Procedure Laterality Date  . ABDOMINAL HYSTERECTOMY    . CESAREAN SECTION  1993  . NECK  SURGERY Left 06/05/2010   release pressure on nerve of left shoulder    OB History    No data available       Home Medications    Prior to Admission medications   Medication Sig Start Date End Date Taking? Authorizing Provider  acetaminophen (TYLENOL) 325 MG tablet Take 325-650 mg by mouth every 6 (six) hours as needed (for pain).    Yes [provider]  aspirin-acetaminophen-caffeine (EXCEDRIN MIGRAINE) [id number] MG tablet Take 1 tablet by mouth every 6 (six) hours as needed for headache or migraine.   Yes [provider]  cyclobenzaprine (FLEXERIL) 10 MG tablet Take 1 tablet (10 mg total) by mouth 2 (two) times daily as needed for muscle spasms. 04/24/17  Yes Kellie ShropshireShrosbree, Emily J, PA-C  lidocaine (LIDODERM) 5 % Place 1 patch onto the skin daily. Remove & Discard patch within 12 hours or as directed by MD Patient taking differently: Place 1 patch onto the skin daily as needed (for pain). Remove & Discard patch within 12 hours or as directed by MD 07/17/16  Yes Joy, Shawn C, PA-C  Multiple Vitamins-Minerals (MULTIVITAMIN WOMEN 50+) TABS Take 1 tablet by mouth daily.   Yes [provider]  triamcinolone cream (KENALOG) 0.1 % Apply 1 application topically 2 (two) times daily. Patient taking differently: Apply 1 application topically 2 (two) times daily as needed (for eczema).  08/01/16 08/01/17 Yes [name], James W, MD    Family History Family History  Problem Relation  Age of Onset  . Hypertension Other        entire family  . Colon polyps Sister   . Breast cancer Sister   . Diabetes Sister        x 6  . Heart disease Mother   . Diabetes Mother   . Anuerysm Father        brain  . Diabetes Father   . Breast cancer Other   . Diabetes Brother        x 2  . Colon cancer Neg Hx   . Esophageal cancer Neg Hx   . Stomach cancer Neg Hx   . Rectal cancer Neg Hx     Social History Social History   Tobacco Use  . Smoking status: Never Smoker  . Smokeless  tobacco: Never Used  Substance Use Topics  . Alcohol use: Yes    Alcohol/week: 0.6 oz    Types: 1 Glasses of wine per week    Comment: 2-3 per week  . Drug use: No     Allergies   Aspirin   Review of Systems Review of Systems  All systems reviewed and negative, other than as noted in HPI.  Physical Exam Updated Vital Signs BP 130/82   Pulse 60   Resp 20   Ht 5\' 5"  (1.651 m)   Wt 88 kg (194 lb 0.1 oz)   SpO2 100%   BMI 32.28 kg/m   Physical Exam  Constitutional: She is oriented to person, place, and time. She appears well-developed and well-nourished. No distress.  Laying in bed with wet rag over R eye. NAD.   HENT:  Head: Normocephalic and atraumatic.  Eyes: Conjunctivae are normal. Pupils are equal, round, and reactive to light. Right eye exhibits no discharge. Left eye exhibits no discharge.  Neck: Neck supple.  Cardiovascular: Normal rate, regular rhythm and normal heart sounds. Exam reveals no gallop and no friction rub.  No murmur heard. Pulmonary/Chest: Effort normal and breath sounds normal. No respiratory distress.  Abdominal: Soft. She exhibits no distension. There is no tenderness.  Musculoskeletal: She exhibits no edema or tenderness.  Neurological: She is alert and oriented to person, place, and time.  Speech clear. Content appropriate. Pt tracking across midline l/r when observing her but when directly asked to follow my finger with her eyes she stares straight ahead and has fluttering/twitching of both eye lids. When speaking her face/mouth seems symmetric but when asked to smile L side of mouth contracts and lips quiver. Tongue seems midline to me. Sensation intact to light touch. Strength 5/5 L side. 4/5 RUE. 4/5 RLE. R hip extension is weak as compared to L but feels stronger when she is asking to flex L hip.   Skin: Skin is warm and dry.  Psychiatric: She has a normal mood and affect. Her behavior is normal. Thought content normal.  Nursing note and  vitals reviewed.    ED Treatments / Results  Labs (all labs ordered are listed, but only abnormal results are displayed) Labs Reviewed  CBC - Abnormal; Notable for the following components:      Result Value   WBC 3.9 (*)    All other components within normal limits  I-STAT CHEM 8, ED - Abnormal; Notable for the following components:   Calcium, Ion 1.14 (*)    All other components within normal limits  PROTIME-INR  APTT  DIFFERENTIAL  COMPREHENSIVE METABOLIC PANEL  HEMOGLOBIN A1C  LIPID PANEL  I-STAT TROPONIN, ED  I-STAT  BETA HCG BLOOD, ED ([facility], [facility], [facility] ONLY)  CBG MONITORING, ED    EKG  EKG Interpretation None       Radiology Ct Angio Head W Or Wo Contrast  Result Date: 05/04/2017 CLINICAL DATA:  57 year old female with right side weakness. Last seen normal 1130 hours. EXAM: CT ANGIOGRAPHY HEAD AND NECK TECHNIQUE: Multidetector CT imaging of the head and neck was performed using the standard protocol during bolus administration of intravenous contrast. Multiplanar CT image reconstructions and MIPs were obtained to evaluate the vascular anatomy. Carotid stenosis measurements (when applicable) are obtained utilizing NASCET criteria, using the distal internal carotid diameter as the denominator. CONTRAST:  50mL ISOVUE-370 IOPAMIDOL (ISOVUE-370) INJECTION 76% COMPARISON:  Head CT without contrast 1214 hours today. FINDINGS: CTA NECK Skeleton: Negative. Upper chest: Minor dependent atelectasis. No superior mediastinal lymphadenopathy. Other neck: Negative.  No cervical lymphadenopathy. Aortic arch: 3 vessel arch configuration. Minimal arch atherosclerosis. No great vessel origin stenosis. Right carotid system: Mildly tortuous right CCA. Negative right carotid bifurcation and cervical right ICA. Left carotid system: Moderately tortuous left CCA. Negative left carotid bifurcation. Vertebral arteries: No proximal right subclavian artery atherosclerosis or stenosis. Normal right vertebral  artery origin. Mildly tortuous right vertebral artery is patent to the skull base without stenosis. Minimal proximal left subclavian artery atherosclerosis, no stenosis. Normal left vertebral artery origin. Tortuous left V1 segment, and cervical left vertebral artery with no stenosis to the skull base. CTA HEAD Posterior circulation: Codominant distal vertebral arteries are normal to the vertebrobasilar junction. Normal right PICA origin. The left AICA appears dominant. Patent bilateral AICA origins. Patent basilar artery without stenosis. Patent SCA origins. Normal left PCA origin. Fetal type right PCA origin. Left posterior communicating artery is present. Bilateral PCA branches are within normal limits. Anterior circulation: Both ICA siphons are patent. No siphon atherosclerosis or stenosis. Normal ophthalmic and posterior communicating artery origins. Patent carotid termini. Normal MCA and ACA origins. Mildly tortuous A1 segments. Normal anterior communicating artery and bilateral ACA branches. Left MCA M1 segment, bifurcation, and left MCA branches are within normal limits. Right MCA M1 segment, bifurcation, and right MCA branches are within normal limits. Venous sinuses: Patent. Anatomic variants: Fetal type right PCA origin. Review of the MIP images confirms the above findings IMPRESSION: Negative CTA head and neck; No emergent large vessel occlusion. Minimal atherosclerosis. No arterial stenosis. Electronically Signed   By: Odessa Fleming M.D.   On: 05/04/2017 13:07   Ct Angio Neck W Or Wo Contrast  Result Date: 05/04/2017 CLINICAL DATA:  57 year old female with right side weakness. Last seen normal 1130 hours. EXAM: CT ANGIOGRAPHY HEAD AND NECK TECHNIQUE: Multidetector CT imaging of the head and neck was performed using the standard protocol during bolus administration of intravenous contrast. Multiplanar CT image reconstructions and MIPs were obtained to evaluate the vascular anatomy. Carotid stenosis  measurements (when applicable) are obtained utilizing NASCET criteria, using the distal internal carotid diameter as the denominator. CONTRAST:  50mL ISOVUE-370 IOPAMIDOL (ISOVUE-370) INJECTION 76% COMPARISON:  Head CT without contrast 1214 hours today. FINDINGS: CTA NECK Skeleton: Negative. Upper chest: Minor dependent atelectasis. No superior mediastinal lymphadenopathy. Other neck: Negative.  No cervical lymphadenopathy. Aortic arch: 3 vessel arch configuration. Minimal arch atherosclerosis. No great vessel origin stenosis. Right carotid system: Mildly tortuous right CCA. Negative right carotid bifurcation and cervical right ICA. Left carotid system: Moderately tortuous left CCA. Negative left carotid bifurcation. Vertebral arteries: No proximal right subclavian artery atherosclerosis or stenosis. Normal right vertebral artery origin. Mildly tortuous  right vertebral artery is patent to the skull base without stenosis. Minimal proximal left subclavian artery atherosclerosis, no stenosis. Normal left vertebral artery origin. Tortuous left V1 segment, and cervical left vertebral artery with no stenosis to the skull base. CTA HEAD Posterior circulation: Codominant distal vertebral arteries are normal to the vertebrobasilar junction. Normal right PICA origin. The left AICA appears dominant. Patent bilateral AICA origins. Patent basilar artery without stenosis. Patent SCA origins. Normal left PCA origin. Fetal type right PCA origin. Left posterior communicating artery is present. Bilateral PCA branches are within normal limits. Anterior circulation: Both ICA siphons are patent. No siphon atherosclerosis or stenosis. Normal ophthalmic and posterior communicating artery origins. Patent carotid termini. Normal MCA and ACA origins. Mildly tortuous A1 segments. Normal anterior communicating artery and bilateral ACA branches. Left MCA M1 segment, bifurcation, and left MCA branches are within normal limits. Right MCA M1  segment, bifurcation, and right MCA branches are within normal limits. Venous sinuses: Patent. Anatomic variants: Fetal type right PCA origin. Review of the MIP images confirms the above findings IMPRESSION: Negative CTA head and neck; No emergent large vessel occlusion. Minimal atherosclerosis. No arterial stenosis. Electronically Signed   By: Odessa Fleming M.D.   On: 05/04/2017 13:07   Mr Brain Wo Contrast  Result Date: 05/04/2017 CLINICAL DATA:  58 year old female code stroke presentation with right side weakness. Last seen normal 1130 hours. EXAM: MRI HEAD WITHOUT CONTRAST TECHNIQUE: Multiplanar, multiecho pulse sequences of the brain and surrounding structures were obtained without intravenous contrast. COMPARISON:  CTA head and neck, and noncontrast head CT earlier today. FINDINGS: Brain: No restricted diffusion or evidence of acute infarction. Normal cerebral volume. Intermittent mild motion artifact. Wallace Cullens and white matter signal is within normal limits for age throughout the brain. No cortical encephalomalacia or chronic cerebral blood products identified. No midline shift, mass effect, evidence of mass lesion, ventriculomegaly, extra-axial collection or acute intracranial hemorrhage. Cervicomedullary junction and pituitary are within normal limits. Vascular: Major intracranial vascular flow voids are preserved. Skull and upper cervical spine: Negative visible cervical spine. Visualized bone marrow signal is within normal limits. Sinuses/Orbits: Negative orbits soft tissues. Paranasal sinuses and mastoids are stable and well pneumatized. Other: Visible internal auditory structures appear normal. Scalp and face soft tissues appear negative. IMPRESSION: No acute intracranial abnormality. Normal for age noncontrast MRI appearance of the brain. Electronically Signed   By: Odessa Fleming M.D.   On: 05/04/2017 13:44   Ct Head Code Stroke Wo Contrast  Result Date: 05/04/2017 CLINICAL DATA:  Code stroke. RIGHT-sided  weakness and facial droop. Last seen normal 11:30 a.m. EXAM: CT HEAD WITHOUT CONTRAST TECHNIQUE: Contiguous axial images were obtained from the base of the skull through the vertex without intravenous contrast. COMPARISON:  04/24/2017. FINDINGS: Brain: No evidence of acute infarction, hemorrhage, hydrocephalus, extra-axial collection or mass lesion/mass effect. Vascular: No hyperdense vessel or unexpected calcification. Skull: Normal. Negative for fracture or focal lesion. Sinuses/Orbits: No acute finding. Other: None. ASPECTS [facility] Stroke Program Early CT Score) - Ganglionic level infarction (caudate, lentiform nuclei, internal capsule, insula, M1-M3 cortex): 7 - Supraganglionic infarction (M4-M6 cortex): 3 Total score (0-10 with 10 being normal): 10 IMPRESSION: 1. Negative exam.  No change from recent priors. 2. ASPECTS is 10. These results were communicated to Dr. Otelia Limes at 12:28 pmon 3/17/2019by text page via the [facility] messaging system. Electronically Signed   By: Elsie Stain M.D.   On: 05/04/2017 12:30    Procedures Procedures (including critical care time)  Medications Ordered in  ED Medications  LORazepam (ATIVAN) 2 MG/ML injection (not administered)  aspirin EC tablet 81 mg (not administered)  atorvastatin (LIPITOR) tablet 20 mg (not administered)  iopamidol (ISOVUE-370) 76 % injection (50 mLs  Contrast Given 05/04/17 1245)     Initial Impression / Assessment and Plan / ED Course  I have reviewed the triage vital signs and the nursing notes.  Pertinent labs & imaging results that were available during my care of the patient were reviewed by me and considered in my medical decision making (see chart for details).     57 year old female with right facial droop and right-sided weakness/numbness.  She was evaluated by neurology.  She had multiple imaging studies including CT, CTa and MRI without acute findings despite ongoing symptoms.  I find her exam very inconsistent. I intentially  moved from either side of the stretcher on my exam and patient would track with her eyes but when asking her to follow my finger she would just stare straight ahead and had odd fluttering movements of both eye lids. Seemed to have stronger R hip extension when asked to flex L hip and when asked to extent R hip in isolation.  Neurology noting multiple inconsistencies on their exam as well.   No evidence of acute CVA and I highly doubt TIA. Recent diagnosis of concussion and reports increased stress. Advised to rest/reiterated concussion instructions. Return precautions discussed including new or worsening symptoms. Outpt FU otherwise.   Final Clinical Impressions(s) / ED Diagnoses   Final diagnoses:  Facial droop    ED Discharge Orders    None       Raeford Razor, MD 05/04/17 1553

## 2017-05-04 NOTE — ED Notes (Signed)
[facility]-Code Stroke Cancel @ 1316-per RN-called by Marylene LandAngela

## 2017-05-04 NOTE — ED Notes (Signed)
[facility] called to call a code stroke on pt per RN Elita QuickJamies request

## 2017-05-04 NOTE — Progress Notes (Signed)
Subjective:   I, Jacqualin Combes, am serving as a scribe for Dr. Hulan Saas, DO.  Chief Complaint: Miranda Green, DOB: 1960/08/28, is a 57 y.o. female who presents for head injury. She has been out of work since last visit. Her husband is with her and states that she did not rest over the weekend as her sister-in-law was in town. Patient does state that she was able to get some rest at night though. She continues to have a headache of low intensity in the frontal region. She was at church yesterday and the loud music was very irritating to her. Patient was seen in ER yesterday for symptoms of stroke.  Reviewing patient's chart neurology who saw her for the cord stroke as well as the emergency room physician were not impressed with her physical exam and felt like there could be even some potential malingering.  Patient is here with her husband who states that she has not been fairly compliant with the medications we discussed and feels that that she was improving before she tried to do too much while she was at church on Sunday.   Injury date : 04/24/17 Visit #: 2  Previous imagine.   History of Present Illness:    Concussion Self-Reported Symptom Score Symptoms rated on a scale 1-6, in last 24 hours  Headache: 4   Nausea: 0  Vomiting: 0  Balance Difficulty: 5  Dizziness: 5  Fatigue: 0  Trouble Falling Asleep: 3  Sleep More Than Usual: 5  Sleep Less Than Usual: 0  Daytime Drowsiness: 5  Photophobia: 5  Phonophobia: 0  Irritability: 6  Sadness: 5  Nervousness: 6  Feeling More Emotional: 6  Numbness or Tingling: 6  Feeling Slowed Down: 6  Feeling Mentally Foggy: 6  Difficulty Concentrating: 4  Difficulty Remembering: 0  Visual Problems: 0    Total Symptom Score: 77 Previous Symptom Score: 101  Review of Systems: Pertinent items are noted in HPI.  Review of History: Past Medical History:  Past Medical History:  Diagnosis Date  . ALLERGIC RHINITIS 10/13/2006  .  ANEMIA-IRON DEFICIENCY 10/13/2006   resolved after had hysterectomy  . ANXIETY 10/13/2006  . ASTHMA 10/13/2006  . Bleeds easily [facility])    Has had problems when has surgery and birthing  . DEPRESSION 10/13/2006   resolved  . Hernia of abdominal wall   . HYPERLIPIDEMIA 10/13/2006   resolved  . Migraines     Past Surgical History:  has a past surgical history that includes Abdominal hysterectomy; Cesarean section (1993); and Neck surgery (Left, 06/05/2010). Family History: family history includes Anuerysm in her father; Breast cancer in her other and sister; Colon polyps in her sister; Diabetes in her brother, father, mother, and sister; Heart disease in her mother; Hypertension in her other. Social History:  reports that  has never smoked. she has never used smokeless tobacco. She reports that she drinks about 0.6 oz of alcohol per week. She reports that she does not use drugs. Current Medications: has a current medication list which includes the following prescription(s): acetaminophen, aspirin-acetaminophen-caffeine, cyclobenzaprine, lidocaine, multivitamin women 50+, and triamcinolone cream. Allergies: is allergic to aspirin.  Objective:    Physical Examination Vitals:   05/05/17 0821  BP: (!) 132/94  Pulse: 67  SpO2: 99%   General appearance: alert, appears stated age and cooperative Head: Normocephalic, without obvious abnormality, atraumatic Eyes: conjunctivae/corneas clear. PERRL, EOM's intact. Fundi benign. Sclera anicteric. Lungs: clear to auscultation bilaterally and percussion Heart: regular rate and  rhythm, S1, S2 normal, no murmur, click, rub or gallop Neurologic: CN 2-12 normal.  Sensation to pain, touch, and proprioception normal.  DTRs  normal in upper and lower extremities. No pathologic reflexes. Neg rhomberg, modified rhomberg, pronator drift, tandem gait, finger-to-nose; see post-concussion vestibular and oculomotor testing in chart Psychiatric: Oriented X3, intact  recent and remote memory, judgement and insight, normal mood affect is flat    Assessment:    Polyarthralgia - Plan: Sed Rate (ESR), Angiotensin converting enzyme, ANA, Calcium, ionized, CBC with Differential/Platelet, Comprehensive metabolic panel, C-reactive protein, Cyclic citrul peptide antibody, IgG, IBC panel, Rheumatoid factor, TSH, VITAMIN D 25 Hydroxy (Vit-D Deficiency, Fractures), PTH, Intact and Calcium, Uric acid, Prolactin  Miranda Green presents with the following head injury   Plan:   Action/Discussion: Reviewed diagnosis, management options, expected outcomes, and the reasons for scheduled and emergent follow-up. Questions were adequately answered. Patient expressed verbal understanding and agreement with the following plan.

## 2017-05-04 NOTE — ED Notes (Signed)
Pt ready for discharge.  Cancel stroke swallow screen per Dr. Juleen ChinaKohut.

## 2017-05-05 ENCOUNTER — Ambulatory Visit: Payer: Federal, State, Local not specified - PPO | Admitting: Family Medicine

## 2017-05-05 ENCOUNTER — Encounter: Payer: Self-pay | Admitting: Family Medicine

## 2017-05-05 ENCOUNTER — Other Ambulatory Visit (INDEPENDENT_AMBULATORY_CARE_PROVIDER_SITE_OTHER): Payer: Federal, State, Local not specified - PPO

## 2017-05-05 VITALS — BP 132/94 | HR 67 | Ht 65.0 in | Wt 194.0 lb

## 2017-05-05 DIAGNOSIS — S060X0D Concussion without loss of consciousness, subsequent encounter: Secondary | ICD-10-CM

## 2017-05-05 DIAGNOSIS — M2550 Pain in unspecified joint: Secondary | ICD-10-CM | POA: Diagnosis not present

## 2017-05-05 LAB — IBC PANEL
IRON: 61 ug/dL (ref 42–145)
Saturation Ratios: 17.9 % — ABNORMAL LOW (ref 20.0–50.0)
Transferrin: 244 mg/dL (ref 212.0–360.0)

## 2017-05-05 LAB — CBC WITH DIFFERENTIAL/PLATELET
BASOS PCT: 1.4 % (ref 0.0–3.0)
Basophils Absolute: 0.1 10*3/uL (ref 0.0–0.1)
EOS ABS: 0.2 10*3/uL (ref 0.0–0.7)
Eosinophils Relative: 6 % — ABNORMAL HIGH (ref 0.0–5.0)
HEMATOCRIT: 38.9 % (ref 36.0–46.0)
Hemoglobin: 13 g/dL (ref 12.0–15.0)
Lymphocytes Relative: 29.4 % (ref 12.0–46.0)
Lymphs Abs: 1.2 10*3/uL (ref 0.7–4.0)
MCHC: 33.3 g/dL (ref 30.0–36.0)
MCV: 85.5 fl (ref 78.0–100.0)
MONOS PCT: 6.8 % (ref 3.0–12.0)
Monocytes Absolute: 0.3 10*3/uL (ref 0.1–1.0)
NEUTROS ABS: 2.2 10*3/uL (ref 1.4–7.7)
Neutrophils Relative %: 56.4 % (ref 43.0–77.0)
PLATELETS: 226 10*3/uL (ref 150.0–400.0)
RBC: 4.56 Mil/uL (ref 3.87–5.11)
RDW: 13.7 % (ref 11.5–15.5)
WBC: 3.9 10*3/uL — AB (ref 4.0–10.5)

## 2017-05-05 LAB — COMPREHENSIVE METABOLIC PANEL
ALBUMIN: 4.3 g/dL (ref 3.5–5.2)
ALT: 13 U/L (ref 0–35)
AST: 14 U/L (ref 0–37)
Alkaline Phosphatase: 37 U/L — ABNORMAL LOW (ref 39–117)
BILIRUBIN TOTAL: 0.5 mg/dL (ref 0.2–1.2)
BUN: 11 mg/dL (ref 6–23)
CO2: 26 mEq/L (ref 19–32)
Calcium: 9.3 mg/dL (ref 8.4–10.5)
Chloride: 105 mEq/L (ref 96–112)
Creatinine, Ser: 0.68 mg/dL (ref 0.40–1.20)
GFR: 114.57 mL/min (ref >60.00)
Glucose, Bld: 88 mg/dL (ref 70–99)
POTASSIUM: 3.7 meq/L (ref 3.5–5.1)
Sodium: 140 mEq/L (ref 135–145)
TOTAL PROTEIN: 7.5 g/dL (ref 6.0–8.3)

## 2017-05-05 LAB — VITAMIN D 25 HYDROXY (VIT D DEFICIENCY, FRACTURES): VITD: 13.3 ng/mL — ABNORMAL LOW (ref 30.00–100.00)

## 2017-05-05 LAB — C-REACTIVE PROTEIN: CRP: 0.4 mg/dL — ABNORMAL LOW (ref 0.5–20.0)

## 2017-05-05 LAB — TSH: TSH: 2.44 u[IU]/mL (ref 0.35–4.50)

## 2017-05-05 LAB — SEDIMENTATION RATE: Sed Rate: 38 mm/hr — ABNORMAL HIGH (ref 0–30)

## 2017-05-05 LAB — URIC ACID: URIC ACID, SERUM: 4.7 mg/dL (ref 2.4–7.0)

## 2017-05-05 NOTE — Assessment & Plan Note (Signed)
Spent  25 minutes with patient face-to-face and had greater than 50% of counseling including as described above in assessment and plan.  Concerning at this point and physical exam today was fairly unremarkable.  Patient was seen in the emergency room and did have some mixed findings at that time.  I doubt that there is any type of vascular compromise, and advanced imaging.  Advanced imaging was independently visualized by me showing no significant bony abnormality.  We will get laboratory workup to rule out anything else or any other pathology that could be contributing to patient's symptoms.  The patient is doing well at that point in 72 hours we need to start patient back at work.  Patient as well as husband is in agreement with the plan.

## 2017-05-05 NOTE — Patient Instructions (Signed)
Good to see you  Please get the vitamins  Lets rest for 3 days Good news is all scans look good Labs downstairs today  See me again in 3 days at 915

## 2017-05-07 LAB — PROLACTIN: PROLACTIN: 11.5 ng/mL

## 2017-05-07 LAB — ANGIOTENSIN CONVERTING ENZYME: Angiotensin-Converting Enzyme: 31 U/L (ref 9–67)

## 2017-05-07 LAB — PTH, INTACT AND CALCIUM
Calcium: 9.2 mg/dL (ref 8.6–10.4)
PTH: 36 pg/mL (ref 14–64)

## 2017-05-07 LAB — ANTI-NUCLEAR AB-TITER (ANA TITER)

## 2017-05-07 LAB — CALCIUM, IONIZED: Calcium, Ion: 4.85 mg/dL (ref 4.8–5.6)

## 2017-05-07 LAB — ANA: ANA: POSITIVE — AB

## 2017-05-07 LAB — RHEUMATOID FACTOR

## 2017-05-07 LAB — CYCLIC CITRUL PEPTIDE ANTIBODY, IGG

## 2017-05-07 NOTE — Progress Notes (Signed)
[name] [address] [place], [place] [address] Phone: [phone] Subjective:    I'm seeing this patient by the request  of:    CC: Concussion follow-up  [id number]  Miranda Green is a 57 y.o. female coming in with complaint of headache polyarthralgia.  Patient was seen for postconcussive syndrome.  Was doing better and then worsening symptoms.  Went to the emergency room for potential facial droop patient's physical exam no change throughout the exam.  On exam the following day with me patient was not found to have any facial droop.  Patient was still symptomatic from the concussion but once again patient was fairly noncompliant.  Patient states she had a headache yesterday but that the intensity is less. They have occurred daily since last visit. She also complains of fatigue.   Symptom score today: 75 Previous score: 77   MRI of the brain completely unremarkable. CT angiogram of the head and neck also unremarkable. Laboratory workup did show an ANA that was positive with a dual pattern but a 1-80 titer sedimentation rate minorly elevated at 38 rest of laboratory workup unremarkable  Past Medical History:  Diagnosis Date  . ALLERGIC RHINITIS 10/13/2006  . ANEMIA-IRON DEFICIENCY 10/13/2006   resolved after had hysterectomy  . ANXIETY 10/13/2006  . ASTHMA 10/13/2006  . Bleeds easily [facility])    Has had problems when has surgery and birthing  . DEPRESSION 10/13/2006   resolved  . Hernia of abdominal wall   . HYPERLIPIDEMIA 10/13/2006   resolved  . Migraines    Past Surgical History:  Procedure Laterality Date  . ABDOMINAL HYSTERECTOMY    . CESAREAN SECTION  1993  . NECK SURGERY Left 06/05/2010   release pressure on nerve of left shoulder   Social History   Socioeconomic History  . Marital status: Married    Spouse name: Not on file  . Number of children: 2  . Years of education: Not on file  . Highest education level: Not on file    Occupational History  . Occupation: bankrupcy specialist  Social Needs  . Financial resource strain: Not on file  . Food insecurity:    Worry: Not on file    Inability: Not on file  . Transportation needs:    Medical: Not on file    Non-medical: Not on file  Tobacco Use  . Smoking status: Never Smoker  . Smokeless tobacco: Never Used  Substance and Sexual Activity  . Alcohol use: Yes    Alcohol/week: 0.6 oz    Types: 1 Glasses of wine per week    Comment: 2-3 per week  . Drug use: No  . Sexual activity: Not on file  Lifestyle  . Physical activity:    Days per week: Not on file    Minutes per session: Not on file  . Stress: Not on file  Relationships  . Social connections:    Talks on phone: Not on file    Gets together: Not on file    Attends religious service: Not on file    Active member of club or organization: Not on file    Attends meetings of clubs or organizations: Not on file    Relationship status: Not on file  Other Topics Concern  . Not on file  Social History Narrative  . Not on file   Allergies  Allergen Reactions  . Aspirin Nausea Only   Family History  Problem Relation Age of Onset  .  Hypertension Other        entire family  . Colon polyps Sister   . Breast cancer Sister   . Diabetes Sister        x 6  . Heart disease Mother   . Diabetes Mother   . Anuerysm Father        brain  . Diabetes Father   . Breast cancer Other   . Diabetes Brother        x 2  . Colon cancer Neg Hx   . Esophageal cancer Neg Hx   . Stomach cancer Neg Hx   . Rectal cancer Neg Hx      Past medical history, social, surgical and family history all reviewed in electronic medical record.  No pertanent information unless stated regarding to the chief complaint.   Review of Systems:Review of systems updated and as accurate as of 05/08/17  No nausea, vomiting, diarrhea, constipation, dizziness, abdominal pain, skin rash, fevers, chills, night sweats, weight loss,  swollen lymph nodes, , joint swelling, muscle aches, chest pain, shortness of breath, mood changes.  Positive headaches, visual changes, dizziness, body aches  Objective  Blood pressure 122/80, pulse 68, height 5\' 5"  (1.651 m), weight 194 lb (88 kg), SpO2 98 %. Systems examined below as of 05/08/17   General: No apparent distress alert and oriented x3 mood and affect normal, dressed appropriately.  HEENT: Pupils equal, extraocular movements intact patient though did not try very hard. Respiratory: Patient's speak in full sentences and does not appear short of breath  Cardiovascular: No lower extremity edema, non tender, no erythema  Skin: Warm dry intact with no signs of infection or rash on extremities or on axial skeleton.  Abdomen: Soft nontender  Neuro: Cranial nerves II through XII are intact, neurovascularly intact in all extremities with 2+ DTRs and 2+ pulses.  Lymph: No lymphadenopathy of posterior or anterior cervical chain or axillae bilaterally.  Gait normal with good balance and coordination.  MSK: Diffuse tender with full range of motion and good stability and symmetric strength and tone of shoulders, elbows, wrist, hip, knee and ankles bilaterally.   Concussion testing shows the patient was not following directions appropriately.  Seem to be out of proportion for the amount.  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    Impression and Recommendations:     This case required medical decision making of moderate complexity.      Note: This dictation was prepared with Dragon dictation along with smaller phrase technology. Any transcriptional errors that result from this process are unintentional.

## 2017-05-08 ENCOUNTER — Ambulatory Visit (INDEPENDENT_AMBULATORY_CARE_PROVIDER_SITE_OTHER): Payer: Federal, State, Local not specified - PPO | Admitting: Family Medicine

## 2017-05-08 ENCOUNTER — Encounter: Payer: Self-pay | Admitting: Family Medicine

## 2017-05-08 ENCOUNTER — Inpatient Hospital Stay: Admission: RE | Admit: 2017-05-08 | Payer: Federal, State, Local not specified - PPO | Source: Ambulatory Visit

## 2017-05-08 DIAGNOSIS — S060X0D Concussion without loss of consciousness, subsequent encounter: Secondary | ICD-10-CM | POA: Diagnosis not present

## 2017-05-08 NOTE — Patient Instructions (Signed)
They did get labs already and only a little inflammation  MRI is normal.  Lets start you again part time on Monday  Continue the vitamins See me again next Friday (ok to double book)

## 2017-05-08 NOTE — Assessment & Plan Note (Signed)
Discussed with patient in great length.  Spent  25 minutes with patient face-to-face and had greater than 50% of counseling including as described above in assessment and plan.  Discussed with patient not making any significant benefit this is seeming that the symptoms seem to be out of proportion to the physical findings at this moment.  Laboratory workup was fairly unremarkable except for a very mild elevation in sedimentation rate.  Patient has had all imaging including CT angiograms of the head and neck as well as MRI of the brain.  Discussed with patient in great length about this not corresponding.  Discussed that I feel that patient would be able to return to work on Monday in a part-time setting with a follow-up in 1 week.  If no significant improvement at that time will need to consider further evaluation with behavioral health to rule out malingering.  Otherwise there is no findings that should be consistent or causing her to have them in this duration.  Patient is in agreement with the plan.

## 2017-05-18 NOTE — Progress Notes (Signed)
[name] [address] [place], [place] [address] Phone: [phone] Subjective:    CC: Concussion follow-up  [id number]  Miranda Green is a 57 y.o. female coming in with complaint of concussion.  Patient was seen and had more of a postconcussive syndrome.  Patient though doing extremely well and was noncompliant initially.  Stated though that she was having worsening symptoms.  Sent for an MRI that was completely unremarkable.  Was to start part-time work.  Has been doing well. Less headaches.  Less severe eyes still has a little tremor from time to time. Seeing eye doctor this week. No trouble with working.       Past Medical History:  Diagnosis Date  . ALLERGIC RHINITIS 10/13/2006  . ANEMIA-IRON DEFICIENCY 10/13/2006   resolved after had hysterectomy  . ANXIETY 10/13/2006  . ASTHMA 10/13/2006  . Bleeds easily [facility])    Has had problems when has surgery and birthing  . DEPRESSION 10/13/2006   resolved  . Hernia of abdominal wall   . HYPERLIPIDEMIA 10/13/2006   resolved  . Migraines    Past Surgical History:  Procedure Laterality Date  . ABDOMINAL HYSTERECTOMY    . CESAREAN SECTION  1993  . NECK SURGERY Left 06/05/2010   release pressure on nerve of left shoulder   Social History   Socioeconomic History  . Marital status: Married    Spouse name: Not on file  . Number of children: 2  . Years of education: Not on file  . Highest education level: Not on file  Occupational History  . Occupation: bankrupcy specialist  Social Needs  . Financial resource strain: Not on file  . Food insecurity:    Worry: Not on file    Inability: Not on file  . Transportation needs:    Medical: Not on file    Non-medical: Not on file  Tobacco Use  . Smoking status: Never Smoker  . Smokeless tobacco: Never Used  Substance and Sexual Activity  . Alcohol use: Yes    Alcohol/week: 0.6 oz    Types: 1 Glasses of wine per week    Comment: 2-3 per week    . Drug use: No  . Sexual activity: Not on file  Lifestyle  . Physical activity:    Days per week: Not on file    Minutes per session: Not on file  . Stress: Not on file  Relationships  . Social connections:    Talks on phone: Not on file    Gets together: Not on file    Attends religious service: Not on file    Active member of club or organization: Not on file    Attends meetings of clubs or organizations: Not on file    Relationship status: Not on file  Other Topics Concern  . Not on file  Social History Narrative  . Not on file   Allergies  Allergen Reactions  . Aspirin Nausea Only   Family History  Problem Relation Age of Onset  . Hypertension Other        entire family  . Colon polyps Sister   . Breast cancer Sister   . Diabetes Sister        x 6  . Heart disease Mother   . Diabetes Mother   . Anuerysm Father        brain  . Diabetes Father   . Breast cancer Other   . Diabetes Brother  x 2  . Colon cancer Neg Hx   . Esophageal cancer Neg Hx   . Stomach cancer Neg Hx   . Rectal cancer Neg Hx      Past medical history, social, surgical and family history all reviewed in electronic medical record.  No pertanent information unless stated regarding to the chief complaint.   Review of Systems:Review of systems updated and as accurate as of 05/19/17  No headache, visual changes, nausea, vomiting, diarrhea, constipation, dizziness, abdominal pain, skin rash, fevers, chills, night sweats, weight loss, swollen lymph nodes, body aches, joint swelling, chest pain, shortness of breath, mood changes.  Positive muscle aches  Objective  Blood pressure 120/80, pulse 67, height 5\' 5"  (1.651 m), weight 175 lb (79.4 kg), SpO2 96 %. Systems examined below as of 05/19/17   General: No apparent distress alert and oriented x3 mood and affect normal, dressed appropriately.  Seems much less anxious than previous exam HEENT: Pupils equal, extraocular movements intact  reactive to light.  No signs of twitching today Respiratory: Patient's speak in full sentences and does not appear short of breath  Cardiovascular: No lower extremity edema, non tender, no erythema  Skin: Warm dry intact with no signs of infection or rash on extremities or on axial skeleton.  Abdomen: Soft nontender  Neuro: Cranial nerves II through XII are intact, neurovascularly intact in all extremities with 2+ DTRs and 2+ pulses.  Lymph: No lymphadenopathy of posterior or anterior cervical chain or axillae bilaterally.  Gait normal with good balance and coordination.  MSK:  Non tender with full range of motion and good stability and symmetric strength and tone of shoulders, elbows, wrist, hip, knee and ankles bilaterally.       Impression and Recommendations:     This case required medical decision making of moderate complexity.      Note: This dictation was prepared with Dragon dictation along with smaller phrase technology. Any transcriptional errors that result from this process are unintentional.

## 2017-05-19 ENCOUNTER — Ambulatory Visit (INDEPENDENT_AMBULATORY_CARE_PROVIDER_SITE_OTHER): Payer: Federal, State, Local not specified - PPO | Admitting: Family Medicine

## 2017-05-19 ENCOUNTER — Encounter: Payer: Self-pay | Admitting: Family Medicine

## 2017-05-19 DIAGNOSIS — S060X0D Concussion without loss of consciousness, subsequent encounter: Secondary | ICD-10-CM

## 2017-05-19 NOTE — Patient Instructions (Signed)
Good to see you  You are doing great  Fish oil 1 gam daily for at least a month  Continue the vitamin D 2000 IU daily indefinitely.  I think the eye exam if a good idea but you look great  Finish up with the other medications See me again in 2-3 weeks if not perfect !

## 2017-05-19 NOTE — Assessment & Plan Note (Signed)
Significant improvement Patient will continue some of the vitamins.  Started full work with no restrictions.  Follow-up in 2-3 weeks if any complaint.

## 2017-05-21 DIAGNOSIS — H2513 Age-related nuclear cataract, bilateral: Secondary | ICD-10-CM | POA: Diagnosis not present

## 2017-05-21 DIAGNOSIS — G514 Facial myokymia: Secondary | ICD-10-CM | POA: Diagnosis not present

## 2017-05-23 ENCOUNTER — Ambulatory Visit (INDEPENDENT_AMBULATORY_CARE_PROVIDER_SITE_OTHER): Payer: Federal, State, Local not specified - PPO

## 2017-05-23 ENCOUNTER — Ambulatory Visit (INDEPENDENT_AMBULATORY_CARE_PROVIDER_SITE_OTHER)
Admission: RE | Admit: 2017-05-23 | Discharge: 2017-05-23 | Disposition: A | Discharge status: Home / Self Care | Payer: Federal, State, Local not specified - PPO | Source: Ambulatory Visit | Attending: Cardiovascular Disease | Admitting: Cardiovascular Disease

## 2017-05-23 DIAGNOSIS — R079 Chest pain, unspecified: Secondary | ICD-10-CM | POA: Diagnosis not present

## 2017-05-23 LAB — EXERCISE TOLERANCE TEST
CHL CUP MPHR: 163 {beats}/min
CHL CUP RESTING HR STRESS: 73 {beats}/min
CSEPPHR: 171 {beats}/min
Estimated workload: 7 METS
Exercise duration (min): 6 min
Exercise duration (sec): 0 s
Percent HR: 104 %
RPE: 15

## 2017-06-11 ENCOUNTER — Ambulatory Visit: Payer: Federal, State, Local not specified - PPO | Admitting: Family Medicine

## 2017-06-11 DIAGNOSIS — Z0289 Encounter for other administrative examinations: Secondary | ICD-10-CM

## 2017-06-15 NOTE — Progress Notes (Signed)
[name] [address] [place], [place] [address] Phone: [phone] Subjective:      CC: Headache postconcussive follow-up  [id number]  Miranda Green is a 57 y.o. female coming in with complaint of headaches.  Patient states significantly better.  Patient did have been postconcussive symptoms previously.  MRI was unremarkable.  Laboratory work-up did show an elevated sedimentation rate as well as a knee.  Also low vitamin D.  Patient has been doing the supplementation and overall has been feeling good.  Did have one episode on 5 April where she did have a passing out but before that she did stand up right away and was having some cramping in her legs.  Patient has been seen by cardiology the next day.  Work-up was benign including a CT scan of the heart.  Patient states that since then she has been feeling pretty good.  Had one episode where she did not feel good but she did not pass out.  Minimal headaches at this time.  Working full-time.  Not having any difficulty.     Past Medical History:  Diagnosis Date  . ALLERGIC RHINITIS 10/13/2006  . ANEMIA-IRON DEFICIENCY 10/13/2006   resolved after had hysterectomy  . ANXIETY 10/13/2006  . ASTHMA 10/13/2006  . Bleeds easily [facility])    Has had problems when has surgery and birthing  . DEPRESSION 10/13/2006   resolved  . Hernia of abdominal wall   . HYPERLIPIDEMIA 10/13/2006   resolved  . Migraines    Past Surgical History:  Procedure Laterality Date  . ABDOMINAL HYSTERECTOMY    . CESAREAN SECTION  1993  . NECK SURGERY Left 06/05/2010   release pressure on nerve of left shoulder   Social History   Socioeconomic History  . Marital status: Married    Spouse name: Not on file  . Number of children: 2  . Years of education: Not on file  . Highest education level: Not on file  Occupational History  . Occupation: bankrupcy specialist  Social Needs  . Financial resource strain: Not on file  . Food  insecurity:    Worry: Not on file    Inability: Not on file  . Transportation needs:    Medical: Not on file    Non-medical: Not on file  Tobacco Use  . Smoking status: Never Smoker  . Smokeless tobacco: Never Used  Substance and Sexual Activity  . Alcohol use: Yes    Alcohol/week: 0.6 oz    Types: 1 Glasses of wine per week    Comment: 2-3 per week  . Drug use: No  . Sexual activity: Not on file  Lifestyle  . Physical activity:    Days per week: Not on file    Minutes per session: Not on file  . Stress: Not on file  Relationships  . Social connections:    Talks on phone: Not on file    Gets together: Not on file    Attends religious service: Not on file    Active member of club or organization: Not on file    Attends meetings of clubs or organizations: Not on file    Relationship status: Not on file  Other Topics Concern  . Not on file  Social History Narrative  . Not on file   Allergies  Allergen Reactions  . Aspirin Nausea Only   Family History  Problem Relation Age of Onset  . Hypertension Other  entire family  . Colon polyps Sister   . Breast cancer Sister   . Diabetes Sister        x 6  . Heart disease Mother   . Diabetes Mother   . Anuerysm Father        brain  . Diabetes Father   . Breast cancer Other   . Diabetes Brother        x 2  . Colon cancer Neg Hx   . Esophageal cancer Neg Hx   . Stomach cancer Neg Hx   . Rectal cancer Neg Hx      Past medical history, social, surgical and family history all reviewed in electronic medical record.  No pertanent information unless stated regarding to the chief complaint.   Review of Systems:Review of systems updated and as accurate as of 06/16/17  Novisual changes, nausea, vomiting, diarrhea, constipation, , abdominal pain, skin rash, fevers, chills, night sweats, weight loss, swollen lymph nodes, body aches, joint swelling, muscle aches, chest pain, shortness of breath, mood changes.  Mild positive  intermittent headaches and dizziness  Objective  Blood pressure 110/80, pulse 91, height  (1.651 m), weight 178 lb (80.7 kg), SpO2 97 %. Systems examined below as of 06/16/17   General: No apparent distress alert and oriented x3 mood and affect normal, dressed appropriately.  HEENT: Pupils equal, extraocular movements intact mild pronounced eyes noted.  Mild goiter Respiratory: Patient's speak in full sentences and does not appear short of breath  Cardiovascular: No lower extremity edema, non tender, no erythema   Skin: Warm dry intact with no signs of infection or rash on extremities or on axial skeleton.  Abdomen: Soft nontender  Neuro: Cranial nerves II through XII are intact, neurovascularly intact in all extremities with 2+ DTRs and 2+ pulses.  Lymph: No lymphadenopathy of posterior or anterior cervical chain or axillae bilaterally.  Gait normal with good balance and coordination.  MSK:  Non tender with full range of motion and good stability and symmetric strength and tone of shoulders, elbows, wrist, hip, knee and ankles bilaterally.     Impression and Recommendations:     This case required medical decision making of moderate complexity.      Note: This dictation was prepared with Dragon dictation along with smaller phrase technology. Any transcriptional errors that result from this process are unintentional.

## 2017-06-16 ENCOUNTER — Ambulatory Visit: Payer: Federal, State, Local not specified - PPO | Admitting: Family Medicine

## 2017-06-16 ENCOUNTER — Encounter: Payer: Self-pay | Admitting: Family Medicine

## 2017-06-16 DIAGNOSIS — S060X0D Concussion without loss of consciousness, subsequent encounter: Secondary | ICD-10-CM

## 2017-06-16 NOTE — Patient Instructions (Signed)
Good to see you  Lets watch and see how you do  I would have someone check your thyroid again in the next year If any other problems write me and will consider Neurology  Please send me an update in a month and tell me how you are doing.  Vitamin D 2000 IU daily indefinitely.  Otherwise see me when you need me

## 2017-06-16 NOTE — Assessment & Plan Note (Signed)
Resolved at this time.  Discussed icing regimen and home exercise.  No neck pain with any radicular symptoms at this time.  I think patient is at maximal medical improvement after the accident.  Patient will follow-up with me again as needed

## 2017-06-25 ENCOUNTER — Ambulatory Visit (INDEPENDENT_AMBULATORY_CARE_PROVIDER_SITE_OTHER): Payer: Federal, State, Local not specified - PPO | Admitting: Internal Medicine

## 2017-06-25 ENCOUNTER — Encounter: Payer: Self-pay | Admitting: Internal Medicine

## 2017-06-25 VITALS — BP 128/82 | HR 86 | Temp 98.8°F | Ht 65.0 in | Wt 181.0 lb

## 2017-06-25 DIAGNOSIS — Z0000 Encounter for general adult medical examination without abnormal findings: Secondary | ICD-10-CM | POA: Diagnosis not present

## 2017-06-25 DIAGNOSIS — E785 Hyperlipidemia, unspecified: Secondary | ICD-10-CM

## 2017-06-25 MED ORDER — ALUMINUM CHLORIDE 20 % EX SOLN: Freq: Every day | CUTANEOUS | 1 refills | Status: DC

## 2017-06-25 NOTE — Progress Notes (Signed)
Subjective:    Patient ID: Miranda Green, female    DOB: [birth year], 57 y.o.   MRN: [id number]  HPI   Here for wellness and f/u;  Overall doing ok;  Pt denies Chest pain, worsening SOB, DOE, wheezing, orthopnea, PND, worsening LE edema, palpitations, dizziness or syncope.  Pt denies neurological change such as new headache, facial or extremity weakness.  Pt denies polydipsia, polyuria, or low sugar symptoms. Pt states overall good compliance with treatment and medications, good tolerability, and has been trying to follow appropriate diet.  Pt denies worsening depressive symptoms, suicidal ideation or panic. No fever, night sweats, wt loss, loss of appetite, or other constitutional symptoms.  Pt states good ability with ADL's, has low fall risk, home safety reviewed and adequate, no other significant changes in hearing or vision, and only occasionally active with exercise.  Has gained several lbs despite gym daily for the last 3 wks.  Ct cardiac scoring zero and declines statin. Wt Readings from Last 3 Encounters:  06/25/17 181 lb (82.1 kg)  06/16/17 178 lb (80.7 kg)  05/19/17 175 lb (79.4 kg)   Past Medical History:  Diagnosis Date  . ALLERGIC RHINITIS 10/13/2006  . ANEMIA-IRON DEFICIENCY 10/13/2006   resolved after had hysterectomy  . ANXIETY 10/13/2006  . ASTHMA 10/13/2006  . Bleeds easily [facility])    Has had problems when has surgery and birthing  . DEPRESSION 10/13/2006   resolved  . Hernia of abdominal wall   . HYPERLIPIDEMIA 10/13/2006   resolved  . Migraines    Past Surgical History:  Procedure Laterality Date  . ABDOMINAL HYSTERECTOMY    . CESAREAN SECTION  1993  . NECK SURGERY Left 06/05/2010   release pressure on nerve of left shoulder    reports that she has never smoked. She has never used smokeless tobacco. She reports that she drinks about 0.6 oz of alcohol per week. She reports that she does not use drugs. family history includes Anuerysm in her father; Breast cancer in  her other and sister; Colon polyps in her sister; Diabetes in her brother, father, mother, and sister; Heart disease in her mother; Hypertension in her other. Allergies  Allergen Reactions  . Aspirin Nausea Only   Current Outpatient Medications on File Prior to Visit  Medication Sig Dispense Refill  . acetaminophen (TYLENOL) 325 MG tablet Take 325-650 mg by mouth every 6 (six) hours as needed (for pain).     Marland Kitchen aspirin-acetaminophen-caffeine (EXCEDRIN MIGRAINE) 250-250-65 MG tablet Take 1 tablet by mouth every 6 (six) hours as needed for headache or migraine.    . cyclobenzaprine (FLEXERIL) 10 MG tablet Take 1 tablet (10 mg total) by mouth 2 (two) times daily as needed for muscle spasms. 20 tablet 0  . lidocaine (LIDODERM) 5 % Place 1 patch onto the skin daily. Remove & Discard patch within 12 hours or as directed by MD (Patient taking differently: Place 1 patch onto the skin daily as needed (for pain). Remove & Discard patch within 12 hours or as directed by MD) 30 patch 0  . Multiple Vitamins-Minerals (MULTIVITAMIN WOMEN 50+) TABS Take 1 tablet by mouth daily.    Marland Kitchen triamcinolone cream (KENALOG) 0.1 % Apply 1 application topically 2 (two) times daily. (Patient taking differently: Apply 1 application topically 2 (two) times daily as needed (for eczema). ) 453.6 g 0   No current facility-administered medications on file prior to visit.    Review of Systems Constitutional: Negative for other unusual diaphoresis, sweats,  appetite or weight changes HENT: Negative for other worsening hearing loss, ear pain, facial swelling, mouth sores or neck stiffness.   Eyes: Negative for other worsening pain, redness or other visual disturbance.  Respiratory: Negative for other stridor or swelling Cardiovascular: Negative for other palpitations or other chest pain  Gastrointestinal: Negative for worsening diarrhea or loose stools, blood in stool, distention or other pain Genitourinary: Negative for hematuria,  flank pain or other change in urine volume.  Musculoskeletal: Negative for myalgias or other joint swelling.  Skin: Negative for other color change, or other wound or worsening drainage.  Neurological: Negative for other syncope or numbness. Hematological: Negative for other adenopathy or swelling Psychiatric/Behavioral: Negative for hallucinations, other worsening agitation, SI, self-injury, or new decreased concentration All other system neg per pt    Objective:   Physical Exam BP 128/82   Pulse 86   Temp 98.8 F (37.1 C) (Oral)   Ht  (1.651 m)   Wt 181 lb (82.1 kg)   SpO2 99%   BMI 30.12 kg/m  VS noted,  Constitutional: Pt is oriented to person, place, and time. Appears well-developed and well-nourished, in no significant distress and comfortable Head: Normocephalic and atraumatic  Eyes: Conjunctivae and EOM are normal. Pupils are equal, round, and reactive to light Right Ear: External ear normal without discharge Left Ear: External ear normal without discharge Nose: Nose without discharge or deformity Mouth/Throat: Oropharynx is without other ulcerations and moist  Neck: Normal range of motion. Neck supple. No JVD present. No tracheal deviation present or significant neck LA or mass Cardiovascular: Normal rate, regular rhythm, normal heart sounds and intact distal pulses.   Pulmonary/Chest: WOB normal and Green sounds without rales or wheezing  Abdominal: Soft. Bowel sounds are normal. NT. No HSM  Musculoskeletal: Normal range of motion. Exhibits no edema Lymphadenopathy: Has no other cervical adenopathy.  Neurological: Pt is alert and oriented to person, place, and time. Pt has normal reflexes. No cranial nerve deficit. Motor grossly intact, Gait intact Skin: Skin is warm and dry. No rash noted or new ulcerations Psychiatric:  Has normal mood and affect. Behavior is normal without agitation No other exam findings Lab Results  Component Value Date   WBC 3.9 (L)  05/05/2017   HGB 13.0 05/05/2017   HCT 38.9 05/05/2017   PLT 226.0 05/05/2017   GLUCOSE 88 05/05/2017   CHOL 236 (H) 06/19/2016   TRIG 107.0 06/19/2016   HDL 42.40 06/19/2016   LDLCALC 172 (H) 06/19/2016   ALT 13 05/05/2017   AST 14 05/05/2017   NA 140 05/05/2017   K 3.7 05/05/2017   CL 105 05/05/2017   CREATININE 0.68 05/05/2017   BUN 11 05/05/2017   CO2 26 05/05/2017   TSH 2.44 05/05/2017   INR 0.94 05/04/2017   HGBA1C 5.2 05/04/2017         Assessment & Plan:

## 2017-06-25 NOTE — Addendum Note (Signed)
Addended by: Corwin Levins on: 06/25/2017 04:51 PM   Modules accepted: Orders

## 2017-06-25 NOTE — Assessment & Plan Note (Signed)
Likely genetic, but per pt would not need statin pending CT cardiac scoring per cardiology ; this was 0 and pt declines statin

## 2017-06-25 NOTE — Patient Instructions (Signed)
Please continue all other medications as before, and refills have been done if requested.  Please have the pharmacy call with any other refills you may need.  Please continue your efforts at being more active, low cholesterol diet, and weight control.  You are otherwise up to date with prevention measures today.  Please keep your appointments with your specialists as you may have planned  No further lab work needed today  Please return in 1 year for your yearly visit, or sooner if needed, with Lab testing done 3-5 days before  

## 2017-07-01 ENCOUNTER — Other Ambulatory Visit: Payer: Self-pay | Admitting: Internal Medicine

## 2017-08-25 ENCOUNTER — Other Ambulatory Visit: Payer: Self-pay | Admitting: Internal Medicine

## 2017-08-25 DIAGNOSIS — Z1231 Encounter for screening mammogram for malignant neoplasm of breast: Secondary | ICD-10-CM

## 2017-09-10 ENCOUNTER — Ambulatory Visit: Payer: Federal, State, Local not specified - PPO

## 2017-10-01 ENCOUNTER — Other Ambulatory Visit: Payer: Self-pay | Admitting: Internal Medicine

## 2017-10-01 ENCOUNTER — Ambulatory Visit
Admission: RE | Admit: 2017-10-01 | Discharge: 2017-10-01 | Disposition: A | Discharge status: Home / Self Care | Payer: Federal, State, Local not specified - PPO | Source: Ambulatory Visit | Attending: Internal Medicine | Admitting: Internal Medicine

## 2017-10-01 ENCOUNTER — Ambulatory Visit: Payer: Federal, State, Local not specified - PPO

## 2017-10-01 DIAGNOSIS — Z1231 Encounter for screening mammogram for malignant neoplasm of breast: Secondary | ICD-10-CM

## 2017-12-24 ENCOUNTER — Ambulatory Visit (INDEPENDENT_AMBULATORY_CARE_PROVIDER_SITE_OTHER): Payer: Federal, State, Local not specified - PPO | Admitting: Family Medicine

## 2018-01-07 DIAGNOSIS — K080 Exfoliation of teeth due to systemic causes: Secondary | ICD-10-CM | POA: Diagnosis not present

## 2018-01-21 ENCOUNTER — Ambulatory Visit (INDEPENDENT_AMBULATORY_CARE_PROVIDER_SITE_OTHER): Payer: Federal, State, Local not specified - PPO | Admitting: Specialist

## 2018-01-21 ENCOUNTER — Ambulatory Visit (INDEPENDENT_AMBULATORY_CARE_PROVIDER_SITE_OTHER): Payer: Self-pay

## 2018-01-21 ENCOUNTER — Encounter (INDEPENDENT_AMBULATORY_CARE_PROVIDER_SITE_OTHER): Payer: Self-pay | Admitting: Specialist

## 2018-01-21 VITALS — BP 123/73 | HR 68 | Ht 65.0 in | Wt 172.0 lb

## 2018-01-21 DIAGNOSIS — K080 Exfoliation of teeth due to systemic causes: Secondary | ICD-10-CM | POA: Diagnosis not present

## 2018-01-21 DIAGNOSIS — M25551 Pain in right hip: Secondary | ICD-10-CM

## 2018-01-21 MED ORDER — TRAMADOL-ACETAMINOPHEN 37.5-325 MG PO TABS: 1.0000 | ORAL_TABLET | Freq: Four times a day (QID) | ORAL | 0 refills | Status: DC | PRN

## 2018-01-21 MED ORDER — ALPRAZOLAM 0.5 MG PO TABS: ORAL_TABLET | ORAL | 0 refills | Status: DC

## 2018-01-21 NOTE — Patient Instructions (Addendum)
Avoid frequent bending and stooping  No lifting greater than 10 lbs. May use ice or moist heat for pain. Weight loss is of benefit. Best medication for lumbar disc disease is arthritis medications like motrin, celebrex and naprosyn. Exercise is important to improve your indurance and does allow people to function better inspite of back pain.  MRI of the right hip ordered to assess for right greater trochanter fracture, or femoral neck stress fracture and assess the musculature about the right hip for signs of tear.

## 2018-01-21 NOTE — Progress Notes (Signed)
Office Visit Note   Patient: Miranda Green           Date of Birth: [birth year]           MRN: [id number] Visit Date: 01/21/2018              Requested by: [name], James W, MD [address] [place], [place] [id number] PCP: [name], James W, MD   Assessment & Plan: Visit Diagnoses:  1. Pain of right hip joint     Plan: Avoid frequent bending and stooping  No lifting greater than 10 lbs. May use ice or moist heat for pain. Weight loss is of benefit. Best medication for lumbar disc disease is arthritis medications like motrin, celebrex and naprosyn. Exercise is important to improve your indurance and does allow people to function better inspite of back pain  MRI of the right hip ordered to assess for right greater trochanter fracture, or femoral neck stress fracture and assess the musculature about the right hip for signs of tear.  Follow-Up Instructions: No follow-ups on file.   Orders:  Orders Placed This Encounter  Procedures  . XR HIP UNILAT W OR W/O PELVIS 2-3 VIEWS RIGHT   No orders of the defined types were placed in this encounter.     Procedures: No procedures performed   Clinical Data: No additional findings.   Subjective: Chief Complaint  Patient presents with  . Right Hip - Pain    57 year old female with history of fall down stairs about 1 3/4 years ago. She fell from the top of a stairwell in a parking garage. She was parked after taking her [facility]. She reports pain when she lies on the right side and with first standing and walking. Initial xrays were negative for right hip injury.   Review of Systems  HENT: Positive for congestion, rhinorrhea, sinus pressure, sinus pain, sore throat and tinnitus.   Respiratory: Positive for cough. Negative for apnea, choking, chest tightness, shortness of breath, wheezing and stridor.   Gastrointestinal: Negative.   Endocrine: Negative.   Genitourinary: Negative.     Musculoskeletal: Positive for gait problem. Negative for arthralgias, back pain, joint swelling, myalgias, neck pain and neck stiffness.  Skin: Negative.   Neurological: Negative for dizziness, seizures, syncope, facial asymmetry, speech difficulty, weakness, light-headedness, numbness and headaches.  Hematological: Negative.  Negative for adenopathy. Does not bruise/bleed easily.  Psychiatric/Behavioral: Negative.  Negative for agitation, behavioral problems, confusion, decreased concentration, dysphoric mood, hallucinations, self-injury, sleep disturbance and suicidal ideas. The patient is not nervous/anxious and is not hyperactive.      Objective: Vital Signs: BP 123/73 (BP Location: Left Arm, Patient Position: Sitting)   Pulse 68   Ht 5\' 5"  (1.651 m)   Wt 172 lb (78 kg)   BMI 28.62 kg/m   Physical Exam  Constitutional: She is oriented to person, place, and time. She appears well-developed and well-nourished.  HENT:  Head: Normocephalic and atraumatic.  Eyes: Pupils are equal, round, and reactive to light. EOM are normal.  Neck: Normal range of motion. Neck supple.  Pulmonary/Chest: Effort normal and breath sounds normal.  Abdominal: Soft. Bowel sounds are normal.  Musculoskeletal: Normal range of motion.  Neurological: She is alert and oriented to person, place, and time.  Skin: Skin is warm and dry.  Psychiatric: She has a normal mood and affect. Her behavior is normal. Judgment and thought content normal.    Back Exam  Tenderness  The patient is experiencing tenderness in the lumbar.  Range of Motion  Extension: normal  Flexion: normal  Lateral bend right: normal  Lateral bend left: normal  Rotation right: normal  Rotation left: normal   Muscle Strength  Right Quadriceps:  5/5  Left Quadriceps:  5/5  Right Hamstrings:  5/5  Left Hamstrings:  5/5   Tests  Straight leg raise right: negative Straight leg raise left: negative  Reflexes  Patellar:  2/4 Achilles: 2/4 Babinski's sign: normal   Other  Toe walk: normal Heel walk: normal Sensation: normal Gait: normal  Erythema: no back redness Scars: absent  Comments:  Right calf is 1/4 inch smaller, She has tenderness right greater trochanter. Pain with ER right hip.       Specialty Comments:  No specialty comments available.  Imaging: No results found.   PMFS History: Patient Active Problem List   Diagnosis Date Noted  . Concussion with no loss of consciousness 04/30/2017  . Neck pain 04/30/2017  . Hot flashes 08/04/2016  . Acute hearing loss, right 06/19/2016  . Eczema 06/19/2016  . Chest pain 10/07/2014  . Migraine 06/26/2013  . Rash 08/18/2011  . Preventative health care 02/05/2011  . CERVICAL RADICULOPATHY, LEFT 04/05/2010  . BREAST CYST, RIGHT 03/19/2010  . PALPITATIONS, RECURRENT 03/16/2010  . FREQUENCY, URINARY 03/16/2010  . SHOULDER PAIN, LEFT 11/10/2009  . ELBOW PAIN, RIGHT 11/10/2009  . SUPERFICIAL THROMBOPHLEBITIS 05/04/2007  . Hyperlipidemia 10/13/2006  . ANEMIA-IRON DEFICIENCY 10/13/2006  . ANXIETY 10/13/2006  . DEPRESSION 10/13/2006  . ALLERGIC RHINITIS 10/13/2006  . ASTHMA 10/13/2006  . COLONIC POLYPS, HX OF 10/13/2006  . OBESITY 10/07/2006   Past Medical History:  Diagnosis Date  . ALLERGIC RHINITIS 10/13/2006  . ANEMIA-IRON DEFICIENCY 10/13/2006   resolved after had hysterectomy  . ANXIETY 10/13/2006  . ASTHMA 10/13/2006  . Bleeds easily [facility])    Has had problems when has surgery and birthing  . DEPRESSION 10/13/2006   resolved  . Hernia of abdominal wall   . HYPERLIPIDEMIA 10/13/2006   resolved  . Migraines     Family History  Problem Relation Age of Onset  . Hypertension Other        entire family  . Colon polyps Sister   . Breast cancer Sister   . Diabetes Sister        x 6  . Heart disease Mother   . Diabetes Mother   . Anuerysm Father        brain  . Diabetes Father   . Breast cancer Other   . Diabetes Brother         x 2  . Colon cancer Neg Hx   . Esophageal cancer Neg Hx   . Stomach cancer Neg Hx   . Rectal cancer Neg Hx     Past Surgical History:  Procedure Laterality Date  . ABDOMINAL HYSTERECTOMY    . CESAREAN SECTION  1993  . NECK SURGERY Left 06/05/2010   release pressure on nerve of left shoulder   Social History   Occupational History  . Occupation: bankrupcy specialist  Tobacco Use  . Smoking status: Never Smoker  . Smokeless tobacco: Never Used  Substance and Sexual Activity  . Alcohol use: Yes    Alcohol/week: 1.0 standard drinks    Types: 1 Glasses of wine per week    Comment: 2-3 per week  . Drug use: No  . Sexual activity: Not on file

## 2018-02-02 ENCOUNTER — Telehealth (INDEPENDENT_AMBULATORY_CARE_PROVIDER_SITE_OTHER): Payer: Self-pay | Admitting: Specialist

## 2018-02-02 NOTE — Telephone Encounter (Signed)
Patient has an appointment on January 20 at 2:30 for an MRI review and needs to be placed on your cancellation list because Dr. Otelia SergeantNitka suspected that she might have a hairline fracture in her hip.  CB#[id number].  Thank you.

## 2018-02-04 NOTE — Telephone Encounter (Signed)
Added to cancellation list 

## 2018-02-05 ENCOUNTER — Ambulatory Visit (INDEPENDENT_AMBULATORY_CARE_PROVIDER_SITE_OTHER): Payer: Federal, State, Local not specified - PPO | Admitting: Specialist

## 2018-02-08 ENCOUNTER — Other Ambulatory Visit (INDEPENDENT_AMBULATORY_CARE_PROVIDER_SITE_OTHER): Payer: Self-pay | Admitting: Specialist

## 2018-02-08 ENCOUNTER — Ambulatory Visit
Admission: RE | Admit: 2018-02-08 | Discharge: 2018-02-08 | Disposition: A | Discharge status: Home / Self Care | Payer: Federal, State, Local not specified - PPO | Source: Ambulatory Visit | Attending: Specialist | Admitting: Specialist

## 2018-02-08 DIAGNOSIS — M25551 Pain in right hip: Secondary | ICD-10-CM

## 2018-03-09 ENCOUNTER — Ambulatory Visit (INDEPENDENT_AMBULATORY_CARE_PROVIDER_SITE_OTHER): Payer: Federal, State, Local not specified - PPO | Admitting: Specialist

## 2018-03-09 ENCOUNTER — Encounter (INDEPENDENT_AMBULATORY_CARE_PROVIDER_SITE_OTHER): Payer: Self-pay | Admitting: Specialist

## 2018-03-09 VITALS — BP 130/72 | HR 57 | Ht 65.0 in | Wt 172.0 lb

## 2018-03-09 DIAGNOSIS — M76891 Other specified enthesopathies of right lower limb, excluding foot: Secondary | ICD-10-CM

## 2018-03-09 DIAGNOSIS — E559 Vitamin D deficiency, unspecified: Secondary | ICD-10-CM

## 2018-03-09 DIAGNOSIS — M461 Sacroiliitis, not elsewhere classified: Secondary | ICD-10-CM

## 2018-03-09 MED ORDER — DICLOFENAC SODIUM 1 % TD GEL: 2.0000 g | Freq: Four times a day (QID) | TRANSDERMAL | 2 refills | Status: DC

## 2018-03-09 MED ORDER — VITAMIN D (ERGOCALCIFEROL) 1.25 MG (50000 UNIT) PO CAPS: 50000.0000 [IU] | ORAL_CAPSULE | ORAL | 0 refills | Status: DC

## 2018-03-09 NOTE — Progress Notes (Signed)
Office Visit Note   Patient: Miranda Green           Date of Birth: 27-May-1960           MRN: [id number] Visit Date: 03/09/2018              Requested by: Corwin Levins, MD [address] [place], [place] [address] PCP: Corwin Levins, MD   Assessment & Plan: Visit Diagnoses:  1. Hip tendonitis, right   2. Sacroiliac inflammation (HCC)   3. Vitamin D deficiency     Plan: Due to low Vitamin D levels you need vitamin D supplements, 50,000 units once a week for 5 weeks then start over the counter 2,000 units daily.  See PT for exercises and iontophoresis of the right hip and right SI joint. If no improvement then consider a rheumatology consult for ANA borderline with right sacroilietis. Diclofenac for right long finger inflamation and catching.   Follow-Up Instructions: Return in about 4 weeks (around 04/06/2018).   Orders:  Orders Placed This Encounter  Procedures  . Ambulatory referral to Physical Therapy   Meds ordered this encounter  Medications  . diclofenac sodium (VOLTAREN) 1 % GEL    Sig: Apply 2 g topically 4 (four) times daily.    Dispense:  3 Tube    Refill:  2  . Vitamin D, Ergocalciferol, (DRISDOL) 1.25 MG (50000 UT) CAPS capsule    Sig: Take 1 capsule (50,000 Units total) by mouth every 7 (seven) days.    Dispense:  5 capsule    Refill:  0      Procedures: No procedures performed   Clinical Data: No additional findings.   Subjective: Chief Complaint  Patient presents with  . Right Hip - Follow-up    MRI right hip review---no changes in symptoms.    58 year old female with history of eczema, she has been experiencing right hip pain with pain since a fall in 05/2017 at [facility] down a flight of stairs from the parking deck. No bowel or bladder difficulty, not UTI recently. Numbness and tingling on occation with sleeping on the right side. Previous test for TB negative in the past. It has been a while though. Prolong standing and walking is  aggravation. Exercise with some improvement. Walking too much with increased pain.    Review of Systems  Constitutional: Negative.   HENT: Negative.   Eyes: Negative.   Respiratory: Negative.   Cardiovascular: Negative.   Gastrointestinal: Negative.   Endocrine: Negative.   Genitourinary: Negative.   Musculoskeletal: Negative.   Skin: Negative.   Allergic/Immunologic: Negative.   Neurological: Negative.   Hematological: Negative.   Psychiatric/Behavioral: Negative.      Objective: Vital Signs: BP 130/72 (BP Location: Left Arm, Patient Position: Sitting)   Pulse (!) 57   Ht 5\' 5"  (1.651 m)   Wt 172 lb (78 kg)   BMI 28.62 kg/m   Physical Exam Constitutional:      Appearance: She is well-developed.  HENT:     Head: Normocephalic and atraumatic.  Eyes:     Pupils: Pupils are equal, round, and reactive to light.  Neck:     Musculoskeletal: Normal range of motion and neck supple.  Pulmonary:     Effort: Pulmonary effort is normal.     Breath sounds: Normal breath sounds.  Abdominal:     General: Bowel sounds are normal.     Palpations: Abdomen is soft.  Skin:  General: Skin is warm and dry.  Neurological:     Mental Status: She is alert and oriented to person, place, and time.  Psychiatric:        Behavior: Behavior normal.        Thought Content: Thought content normal.        Judgment: Judgment normal.     Right Hip Exam   Tenderness  The patient is experiencing tenderness in the greater trochanter and posterior.  Range of Motion  Abduction: abnormal  Adduction: normal  Extension: normal  Flexion: abnormal  External rotation: abnormal  Internal rotation: normal   Tests  FABER: positive Ober: positive  Comments:  Tender right greater trochanter and right SI joint posterior and superior.       Specialty Comments:  No specialty comments available.  Imaging: No results found.   PMFS History: Patient Active Problem List   Diagnosis Date  Noted  . Concussion with no loss of consciousness 04/30/2017  . Neck pain 04/30/2017  . Hot flashes 08/04/2016  . Acute hearing loss, right 06/19/2016  . Eczema 06/19/2016  . Chest pain 10/07/2014  . Migraine 06/26/2013  . Rash 08/18/2011  . Preventative health care 02/05/2011  . CERVICAL RADICULOPATHY, LEFT 04/05/2010  . BREAST CYST, RIGHT 03/19/2010  . PALPITATIONS, RECURRENT 03/16/2010  . FREQUENCY, URINARY 03/16/2010  . SHOULDER PAIN, LEFT 11/10/2009  . ELBOW PAIN, RIGHT 11/10/2009  . SUPERFICIAL THROMBOPHLEBITIS 05/04/2007  . Hyperlipidemia 10/13/2006  . ANEMIA-IRON DEFICIENCY 10/13/2006  . ANXIETY 10/13/2006  . DEPRESSION 10/13/2006  . ALLERGIC RHINITIS 10/13/2006  . ASTHMA 10/13/2006  . COLONIC POLYPS, HX OF 10/13/2006  . OBESITY 10/07/2006   Past Medical History:  Diagnosis Date  . ALLERGIC RHINITIS 10/13/2006  . ANEMIA-IRON DEFICIENCY 10/13/2006   resolved after had hysterectomy  . ANXIETY 10/13/2006  . ASTHMA 10/13/2006  . Bleeds easily [facility])    Has had problems when has surgery and birthing  . DEPRESSION 10/13/2006   resolved  . Hernia of abdominal wall   . HYPERLIPIDEMIA 10/13/2006   resolved  . Migraines     Family History  Problem Relation Age of Onset  . Hypertension Other        entire family  . Colon polyps Sister   . Breast cancer Sister   . Diabetes Sister        x 6  . Heart disease Mother   . Diabetes Mother   . Anuerysm Father        brain  . Diabetes Father   . Breast cancer Other   . Diabetes Brother        x 2  . Colon cancer Neg Hx   . Esophageal cancer Neg Hx   . Stomach cancer Neg Hx   . Rectal cancer Neg Hx     Past Surgical History:  Procedure Laterality Date  . ABDOMINAL HYSTERECTOMY    . CESAREAN SECTION  1993  . NECK SURGERY Left 06/05/2010   release pressure on nerve of left shoulder   Social History   Occupational History  . Occupation: bankrupcy specialist  Tobacco Use  . Smoking status: Never Smoker  .  Smokeless tobacco: Never Used  Substance and Sexual Activity  . Alcohol use: Yes    Alcohol/week: 1.0 standard drinks    Types: 1 Glasses of wine per week    Comment: 2-3 per week  . Drug use: No  . Sexual activity: Not on file

## 2018-03-09 NOTE — Patient Instructions (Signed)
Due to low Vitamin D levels you need vitamin D supplements, 50,000 units once a week for 5 weeks then start over the counter 2,000 units daily.  See PT for exercises and iontophoresis of the right hip and right SI joint. If no improvement then consider a rheumatology consult for ANA borderline with right sacroilietis. Diclofenac for right long finger inflamation and catching.

## 2018-03-18 ENCOUNTER — Ambulatory Visit: Payer: Federal, State, Local not specified - PPO | Attending: Specialist | Admitting: Physical Therapy

## 2018-03-18 ENCOUNTER — Encounter: Payer: Self-pay | Admitting: Physical Therapy

## 2018-03-18 DIAGNOSIS — R262 Difficulty in walking, not elsewhere classified: Secondary | ICD-10-CM

## 2018-03-18 DIAGNOSIS — M533 Sacrococcygeal disorders, not elsewhere classified: Secondary | ICD-10-CM | POA: Diagnosis not present

## 2018-03-18 DIAGNOSIS — M25551 Pain in right hip: Secondary | ICD-10-CM | POA: Insufficient documentation

## 2018-03-18 NOTE — Therapy (Addendum)
[place], [place], [address] Phone: [phone]   Fax:  [phone]  Physical Therapy Evaluation/Discharge Addendum Patient Details  Name: Miranda Green MRN: [id number] Date of Birth: [birth year] Referring Provider (PT): Basil Dess, MD   Encounter Date: 03/18/2018  PT End of Session - 03/18/18 1212    Visit Number  1    Number of Visits  12    Date for PT Re-Evaluation  04/29/18    Authorization Type  BCBS 50 VL    PT Start Time  1100    PT Stop Time  1145    PT Time Calculation (min)  45 min    Activity Tolerance  Patient tolerated treatment well    Behavior During Therapy  [facility] for tasks assessed/performed       Past Medical History:  Diagnosis Date  . ALLERGIC RHINITIS 10/13/2006  . ANEMIA-IRON DEFICIENCY 10/13/2006   resolved after had hysterectomy  . ANXIETY 10/13/2006  . ASTHMA 10/13/2006  . Bleeds easily [facility])    Has had problems when has surgery and birthing  . DEPRESSION 10/13/2006   resolved  . Hernia of abdominal wall   . HYPERLIPIDEMIA 10/13/2006   resolved  . Migraines     Past Surgical History:  Procedure Laterality Date  . ABDOMINAL HYSTERECTOMY    . CESAREAN SECTION  1993  . NECK SURGERY Left 06/05/2010   release pressure on nerve of left shoulder    There were no vitals filed for this visit.   Subjective Assessment - 03/18/18 1103    Subjective  Rt hip Pain started April 2019 after fall down stairs. She has pain and difficulty laying on her Rt side, when she crosses her leg during sitting, with polonged walking,with stairs, with lifting from floor, and with sit to stand. She wants to return to her group fitness classes at the gym.     Pertinent History  PMH:low vid D, concussions, neck pain and surgery 2012,, anx, dep, asthma    Limitations  Other (comment)   getting food off the bottom shelf at grocery store   How long can you sit comfortably?  not limited    How long  can you stand comfortably?  not limited    How long can you walk comfortably?  2 blocks    Diagnostic tests  MRI showing "glute med tednonitis and Right sacroiliitis"    Patient Stated Goals  get back to normal gym routine and not have to get any contisone shots and pain management    Currently in Pain?  Yes    Pain Score  2     Pain Location  Hip    Pain Orientation  Right    Pain Descriptors / Indicators  Aching;Numbness    Pain Type  Chronic pain    Pain Radiating Towards  denies    Pain Onset  More than a month ago    Pain Frequency  Intermittent    Aggravating Factors   laying on her Rt side, crossing legs, stairs, lifting from floor    Pain Relieving Factors  general exercise    Multiple Pain Sites  No         [facility] PT Assessment - 03/18/18 0001      Assessment   Medical Diagnosis  Rt hip tendonitis, SI joint pain     Referring Provider (PT)  Basil Dess, MD    Next MD Visit  not scheduled  Precautions   Precautions  None      Restrictions   Weight Bearing Restrictions  No      Balance Screen   Has the patient fallen in the past 6 months  Yes    How many times?  1    Has the patient had a decrease in activity level because of a fear of falling?   No    Is the patient reluctant to leave their home because of a fear of falling?   No      Home Film/video editor residence    Additional Comments  2 sets of stairs, some pain going down stairs, no pain going up anymore      Prior Function   Level of Independence  Independent    Vocation  Full time employment    Vocation Requirements  work from home for [employer],       Cognition   Overall Cognitive Status  Within Functional Limits for tasks assessed      Observation/Other Assessments   Focus on Therapeutic Outcomes (FOTO)   58% limited      Sensation   Light Touch  Appears Intact      Coordination   Gross Motor Movements are Fluid and Coordinated  Yes      ROM /  Strength   AROM / PROM / Strength  AROM;Strength      AROM   Overall AROM Comments  Rt hip and knee as well as lumbar  WFL    AROM Assessment Site  --    Right/Left Hip  --    Right Hip Extension  --    Right Hip Flexion  --    Right Hip External Rotation   --    Right Hip Internal Rotation   --    Right Hip ABduction  --    Right Hip ADduction  --    Right/Left Knee  --      Strength   Strength Assessment Site  Hip;Knee    Right/Left Hip  Right    Right Hip Flexion  4-/5    Right Hip Extension  4+/5    Right Hip External Rotation   4/5    Right Hip Internal Rotation  4/5    Right Hip ABduction  4-/5    Right Hip ADduction  4/5    Right/Left Knee  Right    Right Knee Flexion  5/5    Right Knee Extension  5/5      Flexibility   Soft Tissue Assessment /Muscle Length  --   tight glutes, pifirormis, It band on Rt     Palpation   Palpation comment  TTP over greater trochanter, no TTP reported at [facility] jt      Special Tests   Other special tests  +Obers test      Transfers   Transfers  Independent with all Transfers      Ambulation/Gait   Gait Comments  WFL                Objective measurements completed on examination: See above findings.      [facility] Adult PT Treatment/Exercise - 03/18/18 0001      Modalities   Modalities  Cryotherapy;Iontophoresis      Cryotherapy   Number Minutes Cryotherapy  10 Minutes    Cryotherapy Location  Hip    Type of Cryotherapy  Ice pack  Iontophoresis   Type of Iontophoresis  Dexamethasone    Location  Rt hip     Dose  1 CC    Time  6 hour patch             PT Education - 03/18/18 1215    Education Details  HEP, Ionto, POC    Person(s) Educated  Patient    Methods  Explanation;Demonstration;Handout    Comprehension  Verbalized understanding;Need further instruction          PT Long Term Goals - 03/18/18 1251      PT LONG TERM GOAL #1   Title  Pt will be I and compliant with HEP. 6 weeks 04/29/18       PT LONG TERM GOAL #2   Title  Pt will increase Rt hip strength to 5/5 MMT. 6 weeks 04/29/18    Status  New      PT LONG TERM GOAL #3   Title  Pt will report less than 1-2/10 overall pain with usual activity including stairs, gym classes, community ambulation. 6 weeks 04/29/18    Status  New      PT LONG TERM GOAL #4   Title  Pt will improve FOTO to less than 42% limited. 6 weeks 04/29/18             Plan - 03/18/18 1215    Clinical Impression Statement  Pt presents with Rt hip tendonitis, SI joint pain/inflammation. She has decreased hip strength and stability, decreased ability for prolonged walking, lifitng, and stairs, and increased pain limiting her functional abillities. She will beneftit from skilled PT to address her deficits .MD requesting iontophoresis right greater trochanter muscle insertion site, ober stretching, SI joint mobilization.     History and Personal Factors relevant to plan of care:  PMH:low vid D, concussions, neck pain and surgery 2012,, anx, dep, asthma    Clinical Presentation  Evolving    Clinical Presentation due to:  up and down pain levels     Clinical Decision Making  Moderate    Rehab Potential  Good    PT Frequency  2x / week    PT Duration  6 weeks    PT Treatment/Interventions  Cryotherapy;Electrical Stimulation;Iontophoresis 22m/ml Dexamethasone;Moist Heat;Ultrasound;Traction;Gait training;Stair training;Functional mobility training;Therapeutic activities;Therapeutic exercise;Balance training;Neuromuscular re-education;Manual techniques;Passive range of motion;Dry needling;Taping;Joint Manipulations;Spinal Manipulations    PT Next Visit Plan  review HEP, assess response to IONTO, (MD requesting iontophoresis right greater trochanter muscle insertion site, ober stretching, SI joint mobilization.)    PT Home Exercise Plan  IT band and pirformis stretch, SLS, SLR flexion, abd, SL clams    Consulted and Agree with Plan of Care  Patient        Patient will benefit from skilled therapeutic intervention in order to improve the following deficits and impairments:  Decreased activity tolerance, Decreased endurance, Decreased range of motion, Decreased strength, Difficulty walking, Pain  Visit Diagnosis: Pain in right hip  Difficulty in walking, not elsewhere classified  Sacrococcygeal disorders, not elsewhere classified     Problem List Patient Active Problem List   Diagnosis Date Noted  . Concussion with no loss of consciousness 04/30/2017  . Neck pain 04/30/2017  . Hot flashes 08/04/2016  . Acute hearing loss, right 06/19/2016  . Eczema 06/19/2016  . Chest pain 10/07/2014  . Migraine 06/26/2013  . Rash 08/18/2011  . Preventative health care 02/05/2011  . CERVICAL RADICULOPATHY, LEFT 04/05/2010  . BREAST CYST, RIGHT 03/19/2010  . PALPITATIONS, RECURRENT 03/16/2010  .  FREQUENCY, URINARY 03/16/2010  . SHOULDER PAIN, LEFT 11/10/2009  . ELBOW PAIN, RIGHT 11/10/2009  . SUPERFICIAL THROMBOPHLEBITIS 05/04/2007  . Hyperlipidemia 10/13/2006  . ANEMIA-IRON DEFICIENCY 10/13/2006  . ANXIETY 10/13/2006  . DEPRESSION 10/13/2006  . ALLERGIC RHINITIS 10/13/2006  . ASTHMA 10/13/2006  . COLONIC POLYPS, HX OF 10/13/2006  . OBESITY 10/07/2006    Miranda Green,PT,DPT 03/18/2018, 1:00 PM   PHYSICAL THERAPY DISCHARGE SUMMARY  Visits from Start of Care: 1  Current functional level related to goals / functional outcomes: See above   Remaining deficits: See above   Education / Equipment: HEP Plan: Patient agrees to discharge.  Patient goals were not met. Patient is being discharged due to not returning since the last visit.  ?????    Miranda Green, PT, DPT 05/11/18 11:38 AM           [facility] [address] [place], [place], [address] Phone: [phone]   Fax:  [phone]  Name: Miranda Green MRN: [id number] Date of Birth: 1960/05/11

## 2018-03-18 NOTE — Patient Instructions (Signed)
Access Code: [email or web address]  URL: [email or web address]  Date: 03/18/2018  Prepared by: Ivery Quale   Exercises  Supine ITB Stretch with Strap - 3 reps - 1 sets - 30 hold - 2x daily - 6x weekly  Supine Piriformis Stretch with Foot on Ground - 3 sets - 30 hold - 2x daily - 6x weekly  Supine Bridge - 10 reps - 2 sets - 5 hold - 2x daily - 6x weekly  Supine Active Straight Leg Raise - 10 reps - 1-3 sets - 2x daily - 6x weekly  Sidelying Hip Abduction - 10 reps - 1-3 sets - 2x daily - 6x weekly  Single Leg Stance - 3-5 reps - 1 sets - as long as you can hold - 2x daily - 6x weekly  Clamshell - 10 reps - 3 sets - 2x daily - 6x weekly

## 2018-03-25 ENCOUNTER — Ambulatory Visit: Payer: Federal, State, Local not specified - PPO | Admitting: Physical Therapy

## 2018-04-01 ENCOUNTER — Other Ambulatory Visit (INDEPENDENT_AMBULATORY_CARE_PROVIDER_SITE_OTHER): Payer: Self-pay | Admitting: Specialist

## 2018-04-01 ENCOUNTER — Ambulatory Visit: Payer: Federal, State, Local not specified - PPO | Admitting: Physical Therapy

## 2018-04-01 NOTE — Telephone Encounter (Signed)
Vitamin D2 refill request

## 2018-04-03 NOTE — Telephone Encounter (Signed)
I called and lmom advised patient per Dr. Otelia Sergeant to take 2000 units over the counter daily.

## 2018-05-10 ENCOUNTER — Other Ambulatory Visit (INDEPENDENT_AMBULATORY_CARE_PROVIDER_SITE_OTHER): Payer: Self-pay | Admitting: Specialist

## 2018-05-11 NOTE — Telephone Encounter (Signed)
Vitamin D refill request.

## 2018-07-01 ENCOUNTER — Telehealth: Payer: Self-pay

## 2018-07-01 ENCOUNTER — Ambulatory Visit (INDEPENDENT_AMBULATORY_CARE_PROVIDER_SITE_OTHER): Payer: Federal, State, Local not specified - PPO | Admitting: Internal Medicine

## 2018-07-01 ENCOUNTER — Other Ambulatory Visit: Payer: Self-pay

## 2018-07-01 ENCOUNTER — Encounter: Payer: Self-pay | Admitting: Internal Medicine

## 2018-07-01 ENCOUNTER — Other Ambulatory Visit: Payer: Self-pay | Admitting: Internal Medicine

## 2018-07-01 ENCOUNTER — Other Ambulatory Visit (INDEPENDENT_AMBULATORY_CARE_PROVIDER_SITE_OTHER): Payer: Federal, State, Local not specified - PPO

## 2018-07-01 VITALS — BP 128/88 | HR 91 | Temp 98.4°F | Ht 65.0 in | Wt 183.0 lb

## 2018-07-01 DIAGNOSIS — Z0000 Encounter for general adult medical examination without abnormal findings: Secondary | ICD-10-CM

## 2018-07-01 DIAGNOSIS — Z23 Encounter for immunization: Secondary | ICD-10-CM | POA: Diagnosis not present

## 2018-07-01 LAB — URINALYSIS, ROUTINE W REFLEX MICROSCOPIC
Bilirubin Urine: NEGATIVE
Hgb urine dipstick: NEGATIVE
Ketones, ur: NEGATIVE
Leukocytes,Ua: NEGATIVE
Nitrite: NEGATIVE
RBC / HPF: NONE SEEN (ref 0–?)
Specific Gravity, Urine: 1.015 (ref 1.000–1.030)
Total Protein, Urine: NEGATIVE
Urine Glucose: NEGATIVE
Urobilinogen, UA: 0.2 (ref 0.0–1.0)
pH: 5.5 (ref 5.0–8.0)

## 2018-07-01 LAB — CBC WITH DIFFERENTIAL/PLATELET
Basophils Absolute: 0.1 10*3/uL (ref 0.0–0.1)
Basophils Relative: 1.3 % (ref 0.0–3.0)
Eosinophils Absolute: 0.2 10*3/uL (ref 0.0–0.7)
Eosinophils Relative: 4.1 % (ref 0.0–5.0)
HCT: 38.5 % (ref 36.0–46.0)
Hemoglobin: 13 g/dL (ref 12.0–15.0)
Lymphocytes Relative: 37.2 % (ref 12.0–46.0)
Lymphs Abs: 1.8 10*3/uL (ref 0.7–4.0)
MCHC: 33.7 g/dL (ref 30.0–36.0)
MCV: 85.3 fl (ref 78.0–100.0)
Monocytes Absolute: 0.5 10*3/uL (ref 0.1–1.0)
Monocytes Relative: 9.7 % (ref 3.0–12.0)
Neutro Abs: 2.4 10*3/uL (ref 1.4–7.7)
Neutrophils Relative %: 47.7 % (ref 43.0–77.0)
Platelets: 225 10*3/uL (ref 150.0–400.0)
RBC: 4.51 Mil/uL (ref 3.87–5.11)
RDW: 13.6 % (ref 11.5–15.5)
WBC: 4.9 10*3/uL (ref 4.0–10.5)

## 2018-07-01 LAB — TSH: TSH: 1.66 u[IU]/mL (ref 0.35–4.50)

## 2018-07-01 LAB — HEPATIC FUNCTION PANEL
ALT: 15 U/L (ref 0–35)
AST: 17 U/L (ref 0–37)
Albumin: 4.2 g/dL (ref 3.5–5.2)
Alkaline Phosphatase: 40 U/L (ref 39–117)
Bilirubin, Direct: 0.1 mg/dL (ref 0.0–0.3)
Total Bilirubin: 0.6 mg/dL (ref 0.2–1.2)
Total Protein: 7.3 g/dL (ref 6.0–8.3)

## 2018-07-01 LAB — BASIC METABOLIC PANEL
BUN: 11 mg/dL (ref 6–23)
CO2: 27 mEq/L (ref 19–32)
Calcium: 9.2 mg/dL (ref 8.4–10.5)
Chloride: 104 mEq/L (ref 96–112)
Creatinine, Ser: 0.75 mg/dL (ref 0.40–1.20)
GFR: 95.88 mL/min (ref >60.00)
Glucose, Bld: 85 mg/dL (ref 70–99)
Potassium: 3.6 mEq/L (ref 3.5–5.1)
Sodium: 139 mEq/L (ref 135–145)

## 2018-07-01 LAB — LIPID PANEL
Cholesterol: 208 mg/dL — ABNORMAL HIGH (ref 0–200)
HDL: 41.3 mg/dL (ref >39.00)
LDL Cholesterol: 149 mg/dL — ABNORMAL HIGH (ref 0–99)
NonHDL: 166.3
Total CHOL/HDL Ratio: 5
Triglycerides: 85 mg/dL (ref 0.0–149.0)
VLDL: 17 mg/dL (ref 0.0–40.0)

## 2018-07-01 MED ORDER — ROSUVASTATIN CALCIUM 10 MG PO TABS: 10.0000 mg | ORAL_TABLET | Freq: Every day | ORAL | 3 refills | Status: DC

## 2018-07-01 NOTE — Patient Instructions (Addendum)

## 2018-07-01 NOTE — Telephone Encounter (Signed)
-----   Message from Corwin Levins, MD sent at 07/01/2018  3:33 PM EDT ----- Left message on MyChart, pt to cont same tx except  The test results show that your current treatment is OK, as the tests are stable, except the LDL cholesterol is still moderately elevated.  We should ask you to start Crestor 10 mg per day, in order to reduce the future risk of heart disease and stroke.  I will send the prescription, and you should hear from the office as well.    Tessie Ordaz to please inform pt, I will do rx

## 2018-07-01 NOTE — Telephone Encounter (Signed)
Pt stated that she discussed with you during her OV that she has CRT test done and her score was a zero and had also seen a heart specialist. She stated that she was not suppose to start Crestor at this time. I told her that I would speak with PCP about this since I was not aware of the conversation and call her back once I have follow up recommendations. Please advise.

## 2018-07-01 NOTE — Telephone Encounter (Signed)
OK sorry, now I recall this discussion.  We will hold on statin at this time , thanks

## 2018-07-01 NOTE — Progress Notes (Signed)
Subjective:    Patient ID: Miranda Green, female    DOB: 03/12/1960, 58 y.o.   MRN: [id number]  HPI  Here for wellness and f/u;  Overall doing ok;  Pt denies Chest pain, worsening SOB, DOE, wheezing, orthopnea, PND, worsening LE edema, palpitations, dizziness or syncope.  Pt denies neurological change such as new headache, facial or extremity weakness.  Pt denies polydipsia, polyuria, or low sugar symptoms. Pt states overall good compliance with treatment and medications, good tolerability, and has been trying to follow appropriate diet.  Pt denies worsening depressive symptoms, suicidal ideation or panic. No fever, night sweats, wt loss, loss of appetite, or other constitutional symptoms.  Pt states good ability with ADL's, has low fall risk, home safety reviewed and adequate, no other significant changes in hearing or vision, and only occasionally active with exercise.  Due for Tdap. No new complaints Past Medical History:  Diagnosis Date  . ALLERGIC RHINITIS 10/13/2006  . ANEMIA-IRON DEFICIENCY 10/13/2006   resolved after had hysterectomy  . ANXIETY 10/13/2006  . ASTHMA 10/13/2006  . Bleeds easily [facility])    Has had problems when has surgery and birthing  . DEPRESSION 10/13/2006   resolved  . Hernia of abdominal wall   . HYPERLIPIDEMIA 10/13/2006   resolved  . Migraines    Past Surgical History:  Procedure Laterality Date  . ABDOMINAL HYSTERECTOMY    . CESAREAN SECTION  1993  . NECK SURGERY Left 06/05/2010   release pressure on nerve of left shoulder    reports that she has never smoked. She has never used smokeless tobacco. She reports current alcohol use of about 1.0 standard drinks of alcohol per week. She reports that she does not use drugs. family history includes Anuerysm in her father; Breast cancer in her sister and another family member; Colon polyps in her sister; Diabetes in her brother, father, mother, and sister; Heart disease in her mother; Hypertension in an other family  member. Allergies  Allergen Reactions  . Aspirin Nausea Only   Current Outpatient Medications on File Prior to Visit  Medication Sig Dispense Refill  . acetaminophen (TYLENOL) 325 MG tablet Take 325-650 mg by mouth every 6 (six) hours as needed (for pain).     Marland Kitchen ALPRAZolam (XANAX) 0.5 MG tablet Take one tablet at the MRI scanner. 2 tablet 0  . aspirin-acetaminophen-caffeine (EXCEDRIN MIGRAINE) 250-250-65 MG tablet Take 1 tablet by mouth every 6 (six) hours as needed for headache or migraine.    . cyclobenzaprine (FLEXERIL) 10 MG tablet Take 1 tablet (10 mg total) by mouth 2 (two) times daily as needed for muscle spasms. 20 tablet 0  . diclofenac sodium (VOLTAREN) 1 % GEL Apply 2 g topically 4 (four) times daily. 3 Tube 2  . DRYSOL 20 % external solution APPLY TO AFFECTED AREA EVERY DAY AT BEDTIME 70 mL 1  . lidocaine (LIDODERM) 5 % Place 1 patch onto the skin daily. Remove & Discard patch within 12 hours or as directed by MD (Patient taking differently: Place 1 patch onto the skin daily as needed (for pain). Remove & Discard patch within 12 hours or as directed by MD) 30 patch 0  . Multiple Vitamins-Minerals (MULTIVITAMIN WOMEN 50+) TABS Take 1 tablet by mouth daily.    . traMADol-acetaminophen (ULTRACET) 37.5-325 MG tablet Take 1 tablet by mouth every 6 (six) hours as needed. 30 tablet 0  . Vitamin D, Ergocalciferol, (DRISDOL) 1.25 MG (50000 UT) CAPS capsule TAKE 1 CAPSULE BY MOUTH EVERY  7 DAYS 4 capsule 1   No current facility-administered medications on file prior to visit.    Review of Systems Constitutional: Negative for other unusual diaphoresis, sweats, appetite or weight changes HENT: Negative for other worsening hearing loss, ear pain, facial swelling, mouth sores or neck stiffness.   Eyes: Negative for other worsening pain, redness or other visual disturbance.  Respiratory: Negative for other stridor or swelling Cardiovascular: Negative for other palpitations or other chest pain   Gastrointestinal: Negative for worsening diarrhea or loose stools, blood in stool, distention or other pain Genitourinary: Negative for hematuria, flank pain or other change in urine volume.  Musculoskeletal: Negative for myalgias or other joint swelling.  Skin: Negative for other color change, or other wound or worsening drainage.  Neurological: Negative for other syncope or numbness. Hematological: Negative for other adenopathy or swelling Psychiatric/Behavioral: Negative for hallucinations, other worsening agitation, SI, self-injury, or new decreased concentration All other system neg per pt    Objective:   Physical Exam BP 128/88   Pulse 91   Temp 98.4 F (36.9 C) (Oral)   Ht 5\' 5"  (1.651 m)   Wt 183 lb (83 kg)   SpO2 98%   BMI 30.45 kg/m  VS noted,  Constitutional: Pt is oriented to person, place, and time. Appears well-developed and well-nourished, in no significant distress and comfortable Head: Normocephalic and atraumatic  Eyes: Conjunctivae and EOM are normal. Pupils are equal, round, and reactive to light Right Ear: External ear normal without discharge Left Ear: External ear normal without discharge Nose: Nose without discharge or deformity Mouth/Throat: Oropharynx is without other ulcerations and moist  Neck: Normal range of motion. Neck supple. No JVD present. No tracheal deviation present or significant neck LA or mass Cardiovascular: Normal rate, regular rhythm, normal heart sounds and intact distal pulses.   Pulmonary/Chest: WOB normal and Green sounds without rales or wheezing  Abdominal: Soft. Bowel sounds are normal. NT. No HSM  Musculoskeletal: Normal range of motion. Exhibits no edema Lymphadenopathy: Has no other cervical adenopathy.  Neurological: Pt is alert and oriented to person, place, and time. Pt has normal reflexes. No cranial nerve deficit. Motor grossly intact, Gait intact Skin: Skin is warm and dry. No rash noted or new ulcerations Psychiatric:   Has normal mood and affect. Behavior is normal without agitation No other exam findings Lab Results  Component Value Date   WBC 4.9 07/01/2018   HGB 13.0 07/01/2018   HCT 38.5 07/01/2018   PLT 225.0 07/01/2018   GLUCOSE 85 07/01/2018   CHOL 208 (H) 07/01/2018   TRIG 85.0 07/01/2018   HDL 41.30 07/01/2018   LDLCALC 149 (H) 07/01/2018   ALT 15 07/01/2018   AST 17 07/01/2018   NA 139 07/01/2018   K 3.6 07/01/2018   CL 104 07/01/2018   CREATININE 0.75 07/01/2018   BUN 11 07/01/2018   CO2 27 07/01/2018   TSH 1.66 07/01/2018   INR 0.94 05/04/2017   HGBA1C 5.2 05/04/2017      Assessment & Plan:

## 2018-07-02 NOTE — Telephone Encounter (Signed)
She would like to know does she need to repeat the test? How long are those results good? Please advise.

## 2018-07-02 NOTE — Telephone Encounter (Signed)
Pt has been informed and expressed understanding.  

## 2018-07-02 NOTE — Telephone Encounter (Signed)
It would be great to check the lipid panel at least once per year, and in the meanwhile, please follow a lower cholesterol diet.  It can be checked sooner, such as 3 mo if she likes .   Thanks Just let us know

## 2018-07-05 ENCOUNTER — Encounter: Payer: Self-pay | Admitting: Internal Medicine

## 2018-07-05 NOTE — Assessment & Plan Note (Signed)

## 2018-08-31 ENCOUNTER — Other Ambulatory Visit: Payer: Self-pay | Admitting: Internal Medicine

## 2018-08-31 DIAGNOSIS — Z1231 Encounter for screening mammogram for malignant neoplasm of breast: Secondary | ICD-10-CM

## 2018-10-14 ENCOUNTER — Ambulatory Visit: Payer: Federal, State, Local not specified - PPO

## 2018-11-18 ENCOUNTER — Other Ambulatory Visit: Payer: Self-pay

## 2018-11-18 ENCOUNTER — Ambulatory Visit
Admission: RE | Admit: 2018-11-18 | Discharge: 2018-11-18 | Disposition: A | Discharge status: Home / Self Care | Payer: Federal, State, Local not specified - PPO | Source: Ambulatory Visit | Attending: Internal Medicine | Admitting: Internal Medicine

## 2018-11-18 DIAGNOSIS — K080 Exfoliation of teeth due to systemic causes: Secondary | ICD-10-CM | POA: Diagnosis not present

## 2018-11-18 DIAGNOSIS — Z1231 Encounter for screening mammogram for malignant neoplasm of breast: Secondary | ICD-10-CM

## 2019-04-12 ENCOUNTER — Telehealth: Payer: Self-pay | Admitting: Cardiovascular Disease

## 2019-04-12 NOTE — Telephone Encounter (Signed)
  STAT if patient feels like he/she is going to faint   1) Are you dizzy now? no  2) Do you feel faint or have you passed out? no  3) Do you have any other symptoms? Pain in the side of the neck and shoulder blades   4) Have you checked your HR and BP (record if available)? Not available, patient was driving during call.   Patient states she started having some lightheadedness and pain in the left side of her neck and shoulder. She has an appointment with Norma Fredrickson 3/1.

## 2019-04-12 NOTE — Telephone Encounter (Signed)
I spoke to the patient who was experiencing Left sided neck pain and shoulder blade pain.  She may have slept wrong, she is thinking, but is concerned.    She denies CP, SOB, but feels her heart "fluttering" and it is causing her to cough.    She has an appointment with Lawson Fiscal on 3/1, but knows to go to the ED if symptoms worsen.  She verbalized understanding.

## 2019-04-14 NOTE — Progress Notes (Addendum)
CARDIOLOGY OFFICE NOTE  Date:  04/19/2019    Miranda Green Date of Birth: 08/04/1960 Medical Record #[id number]  PCP:  Biagio Borg, MD  Cardiologist:  Johnsie Cancel    Chief Complaint  Patient presents with  . Follow-up    Seen for Dr. Johnsie Cancel    History of Present Illness: Miranda Green is a 59 y.o. female who presents today for a work in visit. Seen for Dr. Johnsie Cancel.   She was last seen here in March of 2019 by Dr. Johnsie Cancel after being referred for evaluation of CAD. She has HTN, HLD, obesity - previously on phentermine. She has not wished to take statin therapy.   She called last week with neck pain and dizziness.   The patient does not have symptoms concerning for COVID-19 infection (fever, chills, cough, or new shortness of breath).   Comes in today. Here alone. She notes that she has gained weight - she is working from home - works for the [employer] - more stress with a new role. Basically sits all day - eats and then goes to bed. Did try to start getting back on her treadmill last week - this may her heart pound - her neck hurt - she was dizzy later - she thought she needed evaluation. She wonders if she is "just out of shape". BP is fine. She is not on statin due to her zero calcium score.   Past Medical History:  Diagnosis Date  . ALLERGIC RHINITIS 10/13/2006  . ANEMIA-IRON DEFICIENCY 10/13/2006   resolved after had hysterectomy  . ANXIETY 10/13/2006  . ASTHMA 10/13/2006  . Bleeds easily [facility])    Has had problems when has surgery and birthing  . DEPRESSION 10/13/2006   resolved  . Hernia of abdominal wall   . HYPERLIPIDEMIA 10/13/2006   resolved  . Migraines     Past Surgical History:  Procedure Laterality Date  . ABDOMINAL HYSTERECTOMY    . CESAREAN SECTION  1993  . NECK SURGERY Left 06/05/2010   release pressure on nerve of left shoulder     Medications: Current Meds  Medication Sig  . acetaminophen (TYLENOL) 325 MG tablet Take 325-650 mg by mouth every 6 (six)  hours as needed (for pain).   Marland Kitchen aspirin-acetaminophen-caffeine (EXCEDRIN MIGRAINE) 250-250-65 MG tablet Take 1 tablet by mouth every 6 (six) hours as needed for headache or migraine.  . cyclobenzaprine (FLEXERIL) 10 MG tablet Take 1 tablet (10 mg total) by mouth 2 (two) times daily as needed for muscle spasms.  . diclofenac sodium (VOLTAREN) 1 % GEL Apply 2 g topically 4 (four) times daily.  . DRYSOL 20 % external solution APPLY TO AFFECTED AREA EVERY DAY AT BEDTIME  . lidocaine (LIDODERM) 5 % Place 1 patch onto the skin daily. Remove & Discard patch within 12 hours or as directed by MD  . Multiple Vitamins-Minerals (MULTIVITAMIN WOMEN 50+) TABS Take 1 tablet by mouth daily.  . traMADol-acetaminophen (ULTRACET) 37.5-325 MG tablet Take 1 tablet by mouth every 6 (six) hours as needed.  . Vitamin D, Ergocalciferol, (DRISDOL) 1.25 MG (50000 UT) CAPS capsule TAKE 1 CAPSULE BY MOUTH EVERY 7 DAYS     Allergies: Allergies  Allergen Reactions  . Aspirin Nausea Only    Social History: The patient  reports that she has never smoked. She has never used smokeless tobacco. She reports current alcohol use of about 1.0 standard drinks of alcohol per week. She reports that she does not use drugs.  Family History: The patient's family history includes Anuerysm in her father; Breast cancer in her sister and another family member; Colon polyps in her sister; Diabetes in her brother, father, mother, and sister; Heart disease in her mother; Hypertension in an other family member.   Review of Systems: Please see the history of present illness.   All other systems are reviewed and negative.   Physical Exam: VS:  BP 126/82   Pulse 69   Ht 5\' 5"  (1.651 m)   Wt 184 lb (83.5 kg)   SpO2 100%   BMI 30.62 kg/m  .  BMI Body mass index is 30.62 kg/m.  Wt Readings from Last 3 Encounters:  04/19/19 184 lb (83.5 kg)  07/01/18 183 lb (83 kg)  03/09/18 172 lb (78 kg)    General: Alert and in no acute distress.  She has gained weight.  Cardiac: Regular rate and rhythm. No murmurs, rubs, or gallops. No edema.  Respiratory:  Lungs are clear to auscultation bilaterally with normal work of breathing.  GI: Soft and nontender.  MS: No deformity or atrophy. Gait and ROM intact.  Skin: Warm and dry. Color is normal.  Neuro:  Strength and sensation are intact and no gross focal deficits noted.  Psych: Alert, appropriate and with normal affect.   LABORATORY DATA:  EKG:  EKG is ordered today. This demonstrates NSR - HR is 69. No acute changes.   Lab Results  Component Value Date   WBC 4.9 07/01/2018   HGB 13.0 07/01/2018   HCT 38.5 07/01/2018   PLT 225.0 07/01/2018   GLUCOSE 85 07/01/2018   CHOL 208 (H) 07/01/2018   TRIG 85.0 07/01/2018   HDL 41.30 07/01/2018   LDLCALC 149 (H) 07/01/2018   ALT 15 07/01/2018   AST 17 07/01/2018   NA 139 07/01/2018   K 3.6 07/01/2018   CL 104 07/01/2018   CREATININE 0.75 07/01/2018   BUN 11 07/01/2018   CO2 27 07/01/2018   TSH 1.66 07/01/2018   INR 0.94 05/04/2017   HGBA1C 5.2 05/04/2017     BNP (last 3 results) No results for input(s): BNP in the last 8760 hours.  ProBNP (last 3 results) No results for input(s): PROBNP in the last 8760 hours.   Other Studies Reviewed Today:  GXT Study Highlights 05/2017   Blood pressure demonstrated a normal response to exercise.  Clinically and electrically negative for ischemia    CT CARDIAC IMPRESSION 05/2017: Coronary calcium score of 0.   Electronically Signed   By: 06/2017 M.D.   On: 05/23/2017 15:24    ASSESSMENT AND PLAN:  1. Atypical chest pain - has had prior negative testing - will repeat her GXT given recent events. I suspect this is related to deconditioning - further disposition to follow.   2. HTN - BP is fine here today - no changes made.   3. HLD - has not wished to take statin - calcium score of 0 in the past.   4. FH + CAD  5. Obesity - discussed at length. She does  seem motivated to make changes needed. GXT will help to provide RX for exercise.   6. COVID-19 Education: The signs and symptoms of COVID-19 were discussed with the patient and how to seek care for testing (follow up with PCP or arrange E-visit).  The importance of social distancing, staying at home, hand hygiene and wearing a mask when out in public were discussed today.  Current medicines are reviewed with  the patient today.  The patient does not have concerns regarding medicines other than what has been noted above.  The following changes have been made:  See above.  Labs/ tests ordered today include:    Orders Placed This Encounter  Procedures  . EXERCISE TOLERANCE TEST (ETT)  . EKG 12-Lead     Disposition:   Further disposition to follow.    Patient is agreeable to this plan and will call if any problems develop in the interim.   SignedNorma Fredrickson, NP  04/19/2019 12:42 PM  [facility] [address] [place], [place]  [address] Phone: [phone] Fax: [phone]       Addendum:  Marcheta Grammes are not available until April - patient did not wish to proceed with this testing. She will exercise at home and be in touch with [place] if problems arise.   Rosalio Macadamia, RN, ANP-C [facility] [address] [place], [place]  [address] [phone]

## 2019-04-19 ENCOUNTER — Other Ambulatory Visit: Payer: Self-pay

## 2019-04-19 ENCOUNTER — Encounter: Payer: Self-pay | Admitting: Nurse Practitioner

## 2019-04-19 ENCOUNTER — Ambulatory Visit: Payer: Federal, State, Local not specified - PPO | Admitting: Nurse Practitioner

## 2019-04-19 VITALS — BP 126/82 | HR 69 | Ht 65.0 in | Wt 184.0 lb

## 2019-04-19 DIAGNOSIS — I10 Essential (primary) hypertension: Secondary | ICD-10-CM

## 2019-04-19 DIAGNOSIS — E782 Mixed hyperlipidemia: Secondary | ICD-10-CM | POA: Diagnosis not present

## 2019-04-19 DIAGNOSIS — Z7189 Other specified counseling: Secondary | ICD-10-CM | POA: Diagnosis not present

## 2019-04-19 NOTE — Patient Instructions (Addendum)
After Visit Summary:  We will be checking the following labs today - NONE   Medication Instructions:    Continue with your current medicines.    If you need a refill on your cardiac medications before your next appointment, please call your pharmacy.     Testing/Procedures To Be Arrang  Follow-Up:     Let us know how you do on your treadmill at home,  [employer].   At [facility], you and your health needs are our priority.  As part of our continuing mission to provide you with exceptional heart care, we have created designated Provider Care Teams.  These Care Teams include your primary Cardiologist (physician) and Advanced Practice Providers (APPs -  Physician Assistants and Nurse Practitioners) who all work together to provide you with the care you need, when you need it.  Special Instructions:  . Stay safe, stay home, wash your hands for at least 20 seconds and wear a mask when out in public.  . It was good to talk with you today.    Call the [facility] office at [phone] if you have any questions, problems or concerns.

## 2019-05-27 ENCOUNTER — Other Ambulatory Visit: Payer: Self-pay

## 2019-05-27 ENCOUNTER — Ambulatory Visit: Payer: Federal, State, Local not specified - PPO | Admitting: Internal Medicine

## 2019-05-27 ENCOUNTER — Encounter: Payer: Self-pay | Admitting: Internal Medicine

## 2019-05-27 VITALS — BP 134/70 | HR 66 | Temp 98.5°F | Ht 65.0 in | Wt 183.0 lb

## 2019-05-27 DIAGNOSIS — R55 Syncope and collapse: Secondary | ICD-10-CM

## 2019-05-27 DIAGNOSIS — Z0000 Encounter for general adult medical examination without abnormal findings: Secondary | ICD-10-CM | POA: Diagnosis not present

## 2019-05-27 DIAGNOSIS — E559 Vitamin D deficiency, unspecified: Secondary | ICD-10-CM

## 2019-05-27 DIAGNOSIS — J4520 Mild intermittent asthma, uncomplicated: Secondary | ICD-10-CM

## 2019-05-27 DIAGNOSIS — E538 Deficiency of other specified B group vitamins: Secondary | ICD-10-CM

## 2019-05-27 DIAGNOSIS — E785 Hyperlipidemia, unspecified: Secondary | ICD-10-CM

## 2019-05-27 NOTE — Progress Notes (Signed)
Subjective:    Patient ID: Miranda Green, female    DOB: 22-Sep-1960, 59 y.o.   MRN: [id number]  HPI  Here to f/u after unfortunate episode mar 31 with feverish, chills, weakness and spent day in bed after shot with reduced po intake, then with episode syncope later that evening x 1.  Has hx of recurrent syncope in past, so wasn't overly concerned, except she did strike the right temple area which unfortunately still with swelling puffiness tender today. Pt denies new neurological symptoms such as facial or extremity weakness or numbness.  Pt denies chest pain, increased sob or doe, wheezing, orthopnea, PND, increased LE swelling, palpitations.   Pt denies polydipsia, polyuria.   Past Medical History:  Diagnosis Date  . ALLERGIC RHINITIS 10/13/2006  . ANEMIA-IRON DEFICIENCY 10/13/2006   resolved after had hysterectomy  . ANXIETY 10/13/2006  . ASTHMA 10/13/2006  . Bleeds easily [facility])    Has had problems when has surgery and birthing  . DEPRESSION 10/13/2006   resolved  . Hernia of abdominal wall   . HYPERLIPIDEMIA 10/13/2006   resolved  . Migraines    Past Surgical History:  Procedure Laterality Date  . ABDOMINAL HYSTERECTOMY    . CESAREAN SECTION  1993  . NECK SURGERY Left 06/05/2010   release pressure on nerve of left shoulder    reports that she has never smoked. She has never used smokeless tobacco. She reports current alcohol use of about 1.0 standard drinks of alcohol per week. She reports that she does not use drugs. family history includes Anuerysm in her father; Breast cancer in her sister and another family member; Colon polyps in her sister; Diabetes in her brother, father, mother, and sister; Heart disease in her mother; Hypertension in an other family member. Allergies  Allergen Reactions  . Aspirin Nausea Only   Current Outpatient Medications on File Prior to Visit  Medication Sig Dispense Refill  . DRYSOL 20 % external solution APPLY TO AFFECTED AREA EVERY DAY AT  BEDTIME 70 mL 1  . lidocaine (LIDODERM) 5 % Place 1 patch onto the skin daily. Remove & Discard patch within 12 hours or as directed by MD 30 patch 0  . Multiple Vitamins-Minerals (MULTIVITAMIN WOMEN 50+) TABS Take 1 tablet by mouth daily.    Marland Kitchen acetaminophen (TYLENOL) 325 MG tablet Take 325-650 mg by mouth every 6 (six) hours as needed (for pain).     Marland Kitchen aspirin-acetaminophen-caffeine (EXCEDRIN MIGRAINE) 250-250-65 MG tablet Take 1 tablet by mouth every 6 (six) hours as needed for headache or migraine.    . cyclobenzaprine (FLEXERIL) 10 MG tablet Take 1 tablet (10 mg total) by mouth 2 (two) times daily as needed for muscle spasms. 20 tablet 0  . diclofenac sodium (VOLTAREN) 1 % GEL Apply 2 g topically 4 (four) times daily. (Patient not taking: Reported on 05/27/2019) 3 Tube 2  . traMADol-acetaminophen (ULTRACET) 37.5-325 MG tablet Take 1 tablet by mouth every 6 (six) hours as needed. (Patient not taking: Reported on 05/27/2019) 30 tablet 0  . Vitamin D, Ergocalciferol, (DRISDOL) 1.25 MG (50000 UT) CAPS capsule TAKE 1 CAPSULE BY MOUTH EVERY 7 DAYS (Patient not taking: Reported on 05/27/2019) 4 capsule 1   No current facility-administered medications on file prior to visit.   Review of Systems All otherwise neg per pt     Objective:   Physical Exam BP 134/70   Pulse 66   Temp 98.5 F (36.9 C)   Ht 5\' 5"  (1.651 m)  Wt 183 lb (83 kg)   SpO2 100%   BMI 30.45 kg/m  VS noted,  Constitutional: Pt appears in NAD HENT: Head with right temple supraorbital area 1= tender, swelling  Ut no bruising or other skin change Right Ear: External ear normal.  Left Ear: External ear normal.  Eyes: . Pupils are equal, round, and reactive to light. Conjunctivae and EOM are normal Nose: without d/c or deformity Neck: Neck supple. Gross normal ROM Cardiovascular: Normal rate and regular rhythm.   Pulmonary/Chest: Effort normal and Green sounds without rales or wheezing.  Abd:  Soft, NT, ND, + BS, no  organomegaly Neurological: Pt is alert. At baseline orientation, motor grossly intact Skin: Skin is warm. No rashes, other new lesions, no LE edema Psychiatric: Pt behavior is normal without agitation  All otherwise neg per pt Lab Results  Component Value Date   WBC 4.9 07/01/2018   HGB 13.0 07/01/2018   HCT 38.5 07/01/2018   PLT 225.0 07/01/2018   GLUCOSE 85 07/01/2018   CHOL 208 (H) 07/01/2018   TRIG 85.0 07/01/2018   HDL 41.30 07/01/2018   LDLCALC 149 (H) 07/01/2018   ALT 15 07/01/2018   AST 17 07/01/2018   NA 139 07/01/2018   K 3.6 07/01/2018   CL 104 07/01/2018   CREATININE 0.75 07/01/2018   BUN 11 07/01/2018   CO2 27 07/01/2018   TSH 1.66 07/01/2018   INR 0.94 05/04/2017   HGBA1C 5.2 05/04/2017      Assessment & Plan:

## 2019-05-27 NOTE — Patient Instructions (Addendum)
Please continue all other medications as before, and refills have been done if requested.  Please have the pharmacy call with any other refills you may need.  Please continue your efforts at being more active, low cholesterol diet, and weight control.  You are otherwise up to date with prevention measures today.  Please keep your appointments with your specialists as you may have planned  You will be contacted regarding the referral for: MRI asap  Please make an Appointment to return in May 19, or sooner if needed, also with Lab Appointment for testing done 3-5 days before at the FIRST FLOOR Lab (so this is for TWO appointments - please see the scheduling desk as you leave)

## 2019-05-27 NOTE — Assessment & Plan Note (Signed)
stable overall by history and exam, recent data reviewed with pt, and pt to continue medical treatment as before,  to f/u any worsening symptoms or concerns  

## 2019-05-27 NOTE — Assessment & Plan Note (Addendum)
On mar 31, did not seek attention at the time, normal neuro exam but with swelling and pain to right temple and fatigue, pt asking for MRI head , declines all other such as ecg, cxr, labs today  I spent 31 minutes in preparing to see the patient by review of recent labs, imaging and procedures, obtaining and reviewing separately obtained history, communicating with the patient and family or caregiver, ordering medications, tests or procedures, and documenting clinical information in the EHR including the differential Dx, treatment, and any further evaluation and other management of syncope, hld, asthma

## 2019-05-27 NOTE — Assessment & Plan Note (Signed)
Lab Results  Component Value Date   LDLCALC 149 (H) 07/01/2018  to follow lower chol diet, f/u lab next visit

## 2019-06-02 ENCOUNTER — Other Ambulatory Visit: Payer: Self-pay

## 2019-06-02 ENCOUNTER — Ambulatory Visit
Admission: RE | Admit: 2019-06-02 | Discharge: 2019-06-02 | Disposition: A | Discharge status: Home / Self Care | Payer: Federal, State, Local not specified - PPO | Source: Ambulatory Visit | Attending: Internal Medicine | Admitting: Internal Medicine

## 2019-06-02 DIAGNOSIS — R55 Syncope and collapse: Secondary | ICD-10-CM

## 2019-07-06 ENCOUNTER — Other Ambulatory Visit (INDEPENDENT_AMBULATORY_CARE_PROVIDER_SITE_OTHER): Payer: Federal, State, Local not specified - PPO

## 2019-07-06 DIAGNOSIS — E559 Vitamin D deficiency, unspecified: Secondary | ICD-10-CM

## 2019-07-06 DIAGNOSIS — Z0000 Encounter for general adult medical examination without abnormal findings: Secondary | ICD-10-CM | POA: Diagnosis not present

## 2019-07-06 DIAGNOSIS — E538 Deficiency of other specified B group vitamins: Secondary | ICD-10-CM | POA: Diagnosis not present

## 2019-07-06 LAB — CBC WITH DIFFERENTIAL/PLATELET
Basophils Absolute: 0.1 10*3/uL (ref 0.0–0.1)
Basophils Relative: 1.1 % (ref 0.0–3.0)
Eosinophils Absolute: 0.2 10*3/uL (ref 0.0–0.7)
Eosinophils Relative: 4.2 % (ref 0.0–5.0)
HCT: 39 % (ref 36.0–46.0)
Hemoglobin: 13 g/dL (ref 12.0–15.0)
Lymphocytes Relative: 23 % (ref 12.0–46.0)
Lymphs Abs: 1.3 10*3/uL (ref 0.7–4.0)
MCHC: 33.3 g/dL (ref 30.0–36.0)
MCV: 85.4 fl (ref 78.0–100.0)
Monocytes Absolute: 0.6 10*3/uL (ref 0.1–1.0)
Monocytes Relative: 10 % (ref 3.0–12.0)
Neutro Abs: 3.5 10*3/uL (ref 1.4–7.7)
Neutrophils Relative %: 61.7 % (ref 43.0–77.0)
Platelets: 241 10*3/uL (ref 150.0–400.0)
RBC: 4.57 Mil/uL (ref 3.87–5.11)
RDW: 13.6 % (ref 11.5–15.5)
WBC: 5.7 10*3/uL (ref 4.0–10.5)

## 2019-07-06 LAB — URINALYSIS, ROUTINE W REFLEX MICROSCOPIC
Bilirubin Urine: NEGATIVE
Hgb urine dipstick: NEGATIVE
Leukocytes,Ua: NEGATIVE
Nitrite: NEGATIVE
RBC / HPF: NONE SEEN (ref 0–?)
Specific Gravity, Urine: 1.025 (ref 1.000–1.030)
Total Protein, Urine: NEGATIVE
Urine Glucose: NEGATIVE
Urobilinogen, UA: 1 (ref 0.0–1.0)
pH: 6 (ref 5.0–8.0)

## 2019-07-06 LAB — BASIC METABOLIC PANEL
BUN: 12 mg/dL (ref 6–23)
CO2: 28 mEq/L (ref 19–32)
Calcium: 9.4 mg/dL (ref 8.4–10.5)
Chloride: 105 mEq/L (ref 96–112)
Creatinine, Ser: 0.76 mg/dL (ref 0.40–1.20)
GFR: 94.1 mL/min (ref >60.00)
Glucose, Bld: 109 mg/dL — ABNORMAL HIGH (ref 70–99)
Potassium: 3.8 mEq/L (ref 3.5–5.1)
Sodium: 139 mEq/L (ref 135–145)

## 2019-07-06 LAB — HEPATIC FUNCTION PANEL
ALT: 31 U/L (ref 0–35)
AST: 19 U/L (ref 0–37)
Albumin: 4.4 g/dL (ref 3.5–5.2)
Alkaline Phosphatase: 44 U/L (ref 39–117)
Bilirubin, Direct: 0.1 mg/dL (ref 0.0–0.3)
Total Bilirubin: 0.5 mg/dL (ref 0.2–1.2)
Total Protein: 7.4 g/dL (ref 6.0–8.3)

## 2019-07-06 LAB — LIPID PANEL
Cholesterol: 230 mg/dL — ABNORMAL HIGH (ref 0–200)
HDL: 37.2 mg/dL — ABNORMAL LOW (ref >39.00)
LDL Cholesterol: 168 mg/dL — ABNORMAL HIGH (ref 0–99)
NonHDL: 193.19
Total CHOL/HDL Ratio: 6
Triglycerides: 127 mg/dL (ref 0.0–149.0)
VLDL: 25.4 mg/dL (ref 0.0–40.0)

## 2019-07-07 ENCOUNTER — Other Ambulatory Visit: Payer: Self-pay | Admitting: Internal Medicine

## 2019-07-07 ENCOUNTER — Encounter: Payer: Self-pay | Admitting: Internal Medicine

## 2019-07-07 ENCOUNTER — Ambulatory Visit (INDEPENDENT_AMBULATORY_CARE_PROVIDER_SITE_OTHER): Payer: Federal, State, Local not specified - PPO | Admitting: Internal Medicine

## 2019-07-07 ENCOUNTER — Other Ambulatory Visit: Payer: Self-pay

## 2019-07-07 VITALS — BP 122/86 | HR 85 | Temp 98.6°F | Ht 65.0 in | Wt 182.0 lb

## 2019-07-07 DIAGNOSIS — E559 Vitamin D deficiency, unspecified: Secondary | ICD-10-CM | POA: Diagnosis not present

## 2019-07-07 DIAGNOSIS — R739 Hyperglycemia, unspecified: Secondary | ICD-10-CM | POA: Diagnosis not present

## 2019-07-07 DIAGNOSIS — Z0000 Encounter for general adult medical examination without abnormal findings: Secondary | ICD-10-CM | POA: Diagnosis not present

## 2019-07-07 LAB — TSH: TSH: 0.87 u[IU]/mL (ref 0.35–4.50)

## 2019-07-07 LAB — VITAMIN D 25 HYDROXY (VIT D DEFICIENCY, FRACTURES): VITD: 21.45 ng/mL — ABNORMAL LOW (ref 30.00–100.00)

## 2019-07-07 LAB — VITAMIN B12: Vitamin B-12: 785 pg/mL (ref 211–911)

## 2019-07-07 MED ORDER — VITAMIN D (ERGOCALCIFEROL) 1.25 MG (50000 UNIT) PO CAPS: 50000.0000 [IU] | ORAL_CAPSULE | ORAL | 0 refills | Status: DC

## 2019-07-07 MED ORDER — ATORVASTATIN CALCIUM 20 MG PO TABS: 20.0000 mg | ORAL_TABLET | Freq: Every day | ORAL | 3 refills | Status: DC

## 2019-07-07 NOTE — Patient Instructions (Addendum)
Please change to OTC Vitamin D3 at 2000 units per day, indefinitely.  Please continue all other medications as before, and refills have been done if requested.  Please have the pharmacy call with any other refills you may need.  Please continue your efforts at being more active, low cholesterol diet, and weight control.  You are otherwise up to date with prevention measures today.  Please keep your appointments with your specialists as you may have planned  Please make an Appointment to return for your 1 year visit, or sooner if needed, with Lab testing by Appointment as well, to be done about 3-5 days before at the FIRST FLOOR Lab (so this is for TWO appointments - please see the scheduling desk as you leave)

## 2019-07-07 NOTE — Progress Notes (Signed)
Subjective:    Patient ID: Miranda Green, female    DOB: [birth year], 59 y.o.   MRN: [id number]  HPI  Here for wellness and f/u;  Overall doing ok;  Pt denies Chest pain, worsening SOB, DOE, wheezing, orthopnea, PND, worsening LE edema, palpitations, dizziness or syncope.  Pt denies neurological change such as new headache, facial or extremity weakness.  Pt denies polydipsia, polyuria, or low sugar symptoms. Pt states overall good compliance with treatment and medications, good tolerability, and has been trying to follow appropriate diet.  Pt denies worsening depressive symptoms, suicidal ideation or panic. No fever, night sweats, wt loss, loss of appetite, or other constitutional symptoms.  Pt states good ability with ADL's, has low fall risk, home safety reviewed and adequate, no other significant changes in hearing or vision, and only occasionally active with exercise. Wt Readings from Last 3 Encounters:  07/07/19 182 lb (82.6 kg)  05/27/19 183 lb (83 kg)  04/19/19 184 lb (83.5 kg)  [place] some wt with pandemic. No new complaints Past Medical History:  Diagnosis Date  . ALLERGIC RHINITIS 10/13/2006  . ANEMIA-IRON DEFICIENCY 10/13/2006   resolved after had hysterectomy  . ANXIETY 10/13/2006  . ASTHMA 10/13/2006  . Bleeds easily [facility])    Has had problems when has surgery and birthing  . DEPRESSION 10/13/2006   resolved  . Hernia of abdominal wall   . HYPERLIPIDEMIA 10/13/2006   resolved  . Migraines    Past Surgical History:  Procedure Laterality Date  . ABDOMINAL HYSTERECTOMY    . CESAREAN SECTION  1993  . NECK SURGERY Left 06/05/2010   release pressure on nerve of left shoulder    reports that she has never smoked. She has never used smokeless tobacco. She reports current alcohol use of about 1.0 standard drinks of alcohol per week. She reports that she does not use drugs. family history includes Anuerysm in her father; Breast cancer in her sister and another family member; Colon  polyps in her sister; Diabetes in her brother, father, mother, and sister; Heart disease in her mother; Hypertension in an other family member. Allergies  Allergen Reactions  . Aspirin Nausea Only   Current Outpatient Medications on File Prior to Visit  Medication Sig Dispense Refill  . acetaminophen (TYLENOL) 325 MG tablet Take 325-650 mg by mouth every 6 (six) hours as needed (for pain).     Marland Kitchen aspirin-acetaminophen-caffeine (EXCEDRIN MIGRAINE) 250-250-65 MG tablet Take 1 tablet by mouth every 6 (six) hours as needed for headache or migraine.    . cyclobenzaprine (FLEXERIL) 10 MG tablet Take 1 tablet (10 mg total) by mouth 2 (two) times daily as needed for muscle spasms. 20 tablet 0  . DRYSOL 20 % external solution APPLY TO AFFECTED AREA EVERY DAY AT BEDTIME 70 mL 1  . lidocaine (LIDODERM) 5 % Place 1 patch onto the skin daily. Remove & Discard patch within 12 hours or as directed by MD 30 patch 0  . Multiple Vitamins-Minerals (MULTIVITAMIN WOMEN 50+) TABS Take 1 tablet by mouth daily.     No current facility-administered medications on file prior to visit.   Review of Systems All otherwise neg per pt    Objective:   Physical Exam BP 122/86 (BP Location: Left Arm, Patient Position: Sitting, Cuff Size: Large)   Pulse 85   Temp 98.6 F (37 C) (Oral)   Ht 5\' 5"  (1.651 m)   Wt 182 lb (82.6 kg)   SpO2 97%   BMI 30.29  kg/m  VS noted,  Constitutional: Pt appears in NAD HENT: Head: NCAT.  Right Ear: External ear normal.  Left Ear: External ear normal.  Eyes: . Pupils are equal, round, and reactive to light. Conjunctivae and EOM are normal Nose: without d/c or deformity Neck: Neck supple. Gross normal ROM Cardiovascular: Normal rate and regular rhythm.   Pulmonary/Chest: Effort normal and Green sounds without rales or wheezing.  Abd:  Soft, NT, ND, + BS, no organomegaly Neurological: Pt is alert. At baseline orientation, motor grossly intact Skin: Skin is warm. No rashes, other new  lesions, no LE edema Psychiatric: Pt behavior is normal without agitation  All otherwise neg per pt Lab Results  Component Value Date   WBC 5.7 07/06/2019   HGB 13.0 07/06/2019   HCT 39.0 07/06/2019   PLT 241.0 07/06/2019   GLUCOSE 109 (H) 07/06/2019   CHOL 230 (H) 07/06/2019   TRIG 127.0 07/06/2019   HDL 37.20 (L) 07/06/2019   LDLCALC 168 (H) 07/06/2019   ALT 31 07/06/2019   AST 19 07/06/2019   NA 139 07/06/2019   K 3.8 07/06/2019   CL 105 07/06/2019   CREATININE 0.76 07/06/2019   BUN 12 07/06/2019   CO2 28 07/06/2019   TSH 0.87 07/06/2019   INR 0.94 05/04/2017   HGBA1C 5.2 05/04/2017      Assessment & Plan:

## 2019-07-10 ENCOUNTER — Encounter: Payer: Self-pay | Admitting: Internal Medicine

## 2019-07-10 DIAGNOSIS — R739 Hyperglycemia, unspecified: Secondary | ICD-10-CM | POA: Insufficient documentation

## 2019-07-10 DIAGNOSIS — E559 Vitamin D deficiency, unspecified: Secondary | ICD-10-CM | POA: Insufficient documentation

## 2019-07-10 NOTE — Assessment & Plan Note (Signed)
For oral replacement 

## 2019-07-10 NOTE — Assessment & Plan Note (Signed)
For a1c with labs 

## 2019-07-10 NOTE — Assessment & Plan Note (Signed)

## 2019-08-05 ENCOUNTER — Ambulatory Visit (HOSPITAL_COMMUNITY)
Admission: EM | Admit: 2019-08-05 | Discharge: 2019-08-05 | Disposition: A | Discharge status: Home / Self Care | Payer: Federal, State, Local not specified - PPO | Attending: Family Medicine | Admitting: Family Medicine

## 2019-08-05 ENCOUNTER — Encounter (HOSPITAL_COMMUNITY): Payer: Self-pay

## 2019-08-05 DIAGNOSIS — B349 Viral infection, unspecified: Secondary | ICD-10-CM

## 2019-08-05 DIAGNOSIS — R0789 Other chest pain: Secondary | ICD-10-CM

## 2019-08-05 DIAGNOSIS — Z79899 Other long term (current) drug therapy: Secondary | ICD-10-CM | POA: Diagnosis not present

## 2019-08-05 DIAGNOSIS — Z20822 Contact with and (suspected) exposure to covid-19: Secondary | ICD-10-CM | POA: Insufficient documentation

## 2019-08-05 DIAGNOSIS — J45909 Unspecified asthma, uncomplicated: Secondary | ICD-10-CM | POA: Insufficient documentation

## 2019-08-05 DIAGNOSIS — E785 Hyperlipidemia, unspecified: Secondary | ICD-10-CM | POA: Diagnosis not present

## 2019-08-05 DIAGNOSIS — R52 Pain, unspecified: Secondary | ICD-10-CM

## 2019-08-05 DIAGNOSIS — R519 Headache, unspecified: Secondary | ICD-10-CM

## 2019-08-05 DIAGNOSIS — Z90710 Acquired absence of both cervix and uterus: Secondary | ICD-10-CM | POA: Diagnosis not present

## 2019-08-05 DIAGNOSIS — Z7982 Long term (current) use of aspirin: Secondary | ICD-10-CM | POA: Insufficient documentation

## 2019-08-05 DIAGNOSIS — R5381 Other malaise: Secondary | ICD-10-CM

## 2019-08-05 NOTE — Discharge Instructions (Addendum)

## 2019-08-05 NOTE — ED Provider Notes (Signed)
[place]   MRN: [id number] DOB: Oct 15, 1960  Subjective:   Miranda Green is a 59 y.o. female presenting for 3 day history of acute onset fatigue, malaise, weakness, subjective fever, chills, body aches, posterior headaches and mild intermittent lower chest pain.  Patient has had her Covid vaccination, completed this at the end of March.  Denies any sick contacts.  Patient states that she was under a lot of stress last weekend and states that she felt sick since then.  No current facility-administered medications for this encounter.  Current Outpatient Medications:  .  acetaminophen (TYLENOL) 325 MG tablet, Take 325-650 mg by mouth every 6 (six) hours as needed (for pain). , Disp: , Rfl:  .  aspirin-acetaminophen-caffeine (EXCEDRIN MIGRAINE) 250-250-65 MG tablet, Take 1 tablet by mouth every 6 (six) hours as needed for headache or migraine., Disp: , Rfl:  .  atorvastatin (LIPITOR) 20 MG tablet, Take 1 tablet (20 mg total) by mouth daily., Disp: 90 tablet, Rfl: 3 .  cyclobenzaprine (FLEXERIL) 10 MG tablet, Take 1 tablet (10 mg total) by mouth 2 (two) times daily as needed for muscle spasms., Disp: 20 tablet, Rfl: 0 .  DRYSOL 20 % external solution, APPLY TO AFFECTED AREA EVERY DAY AT BEDTIME, Disp: 70 mL, Rfl: 1 .  lidocaine (LIDODERM) 5 %, Place 1 patch onto the skin daily. Remove & Discard patch within 12 hours or as directed by MD, Disp: 30 patch, Rfl: 0 .  Multiple Vitamins-Minerals (MULTIVITAMIN WOMEN 50+) TABS, Take 1 tablet by mouth daily., Disp: , Rfl:  .  Vitamin D, Ergocalciferol, (DRISDOL) 1.25 MG (50000 UNIT) CAPS capsule, Take 1 capsule (50,000 Units total) by mouth every 7 (seven) days., Disp: 12 capsule, Rfl: 0   Allergies  Allergen Reactions  . Aspirin Nausea Only    Past Medical History:  Diagnosis Date  . ALLERGIC RHINITIS 10/13/2006  . ANEMIA-IRON DEFICIENCY 10/13/2006   resolved after had hysterectomy  . ANXIETY 10/13/2006  . ASTHMA 10/13/2006  . Bleeds  easily [facility])    Has had problems when has surgery and birthing  . DEPRESSION 10/13/2006   resolved  . Hernia of abdominal wall   . HYPERLIPIDEMIA 10/13/2006   resolved  . Migraines      Past Surgical History:  Procedure Laterality Date  . ABDOMINAL HYSTERECTOMY    . CESAREAN SECTION  1993  . NECK SURGERY Left 06/05/2010   release pressure on nerve of left shoulder    Family History  Problem Relation Age of Onset  . Hypertension Other        entire family  . Colon polyps Sister   . Breast cancer Sister   . Diabetes Sister        x 6  . Heart disease Mother   . Diabetes Mother   . Anuerysm Father        brain  . Diabetes Father   . Breast cancer Other   . Diabetes Brother        x 2  . Colon cancer Neg Hx   . Esophageal cancer Neg Hx   . Stomach cancer Neg Hx   . Rectal cancer Neg Hx     Social History   Tobacco Use  . Smoking status: Never Smoker  . Smokeless tobacco: Never Used  Substance Use Topics  . Alcohol use: Yes    Alcohol/week: 2.0 standard drinks    Types: 2 Glasses of wine per week  . Drug use: No  ROS   Objective:   Vitals: BP 138/79   Pulse 62   Temp 98.3 F (36.8 C) (Oral)   Resp 16   Ht 5\' 5"  (1.651 m)   Wt 180 lb (81.6 kg)   SpO2 100%   BMI 29.95 kg/m   Physical Exam Constitutional:      General: She is not in acute distress.    Appearance: Normal appearance. She is well-developed and normal weight. She is not ill-appearing, toxic-appearing or diaphoretic.  HENT:     Head: Normocephalic and atraumatic.     Right Ear: Tympanic membrane, ear canal and external ear normal. No drainage or tenderness. No middle ear effusion. Tympanic membrane is not erythematous.     Left Ear: Tympanic membrane, ear canal and external ear normal. No drainage or tenderness.  No middle ear effusion. Tympanic membrane is not erythematous.     Nose: Nose normal. No congestion or rhinorrhea.     Mouth/Throat:     Mouth: Mucous membranes are moist. No  oral lesions.     Pharynx: Oropharynx is clear. No pharyngeal swelling, oropharyngeal exudate, posterior oropharyngeal erythema or uvula swelling.     Tonsils: No tonsillar exudate or tonsillar abscesses.  Eyes:     General: No scleral icterus.       Right eye: No discharge.        Left eye: No discharge.     Extraocular Movements: Extraocular movements intact.     Right eye: Normal extraocular motion.     Left eye: Normal extraocular motion.     Conjunctiva/sclera: Conjunctivae normal.     Pupils: Pupils are equal, round, and reactive to light.  Cardiovascular:     Rate and Rhythm: Normal rate and regular rhythm.     Heart sounds: Normal heart sounds. No murmur heard.  No friction rub. No gallop.   Pulmonary:     Effort: Pulmonary effort is normal. No respiratory distress.     Breath sounds: Normal breath sounds. No stridor. No wheezing, rhonchi or rales.  Abdominal:     General: Bowel sounds are normal. There is no distension.     Palpations: Abdomen is soft. There is no mass.     Tenderness: There is no abdominal tenderness. There is no right CVA tenderness, left CVA tenderness, guarding or rebound.  Musculoskeletal:     Cervical back: Normal range of motion and neck supple.  Lymphadenopathy:     Cervical: No cervical adenopathy.  Skin:    General: Skin is warm and dry.     Coloration: Skin is not pale.     Findings: No rash.  Neurological:     General: No focal deficit present.     Mental Status: She is alert and oriented to person, place, and time.     Cranial Nerves: No cranial nerve deficit.     Motor: No weakness.     Coordination: Coordination normal.     Gait: Gait normal.     Deep Tendon Reflexes: Reflexes normal.  Psychiatric:        Mood and Affect: Mood normal.        Behavior: Behavior normal.        Thought Content: Thought content normal.        Judgment: Judgment normal.     Assessment and Plan :   PDMP not reviewed this encounter.  1. Viral  syndrome   2. Body aches   3. Generalized headaches   4. Atypical chest pain  5. Malaise     Will manage for viral illness such as viral URI, viral syndrome, viral rhinitis, COVID-19. Counseled patient on nature of COVID-19 including modes of transmission, diagnostic testing, management and supportive care.  Offered scripts for symptomatic relief. COVID 19 testing is pending. Counseled patient on potential for adverse effects with medications prescribed/recommended today, ER and return-to-clinic precautions discussed, patient verbalized understanding.    Wallis Bamberg, PA-C 08/05/19 1148

## 2019-08-05 NOTE — ED Triage Notes (Signed)
Pt c/o weakness, chills, fever body aches, fatiguex3 days. Pt denies any other symptoms.

## 2019-08-06 LAB — SARS CORONAVIRUS 2 (TAT 6-24 HRS): SARS Coronavirus 2: NEGATIVE

## 2019-09-26 ENCOUNTER — Other Ambulatory Visit: Payer: Self-pay | Admitting: Internal Medicine

## 2019-09-27 NOTE — Telephone Encounter (Signed)
LVM to inform pt that "plan to change to OTC Vitamin D3 at 2000 units per day, indefinitely" as per instructions on on 07/07/19; instructed pt to call back if questions/concerns.

## 2019-10-18 ENCOUNTER — Other Ambulatory Visit: Payer: Self-pay | Admitting: Internal Medicine

## 2019-10-18 DIAGNOSIS — Z1231 Encounter for screening mammogram for malignant neoplasm of breast: Secondary | ICD-10-CM

## 2019-11-24 ENCOUNTER — Ambulatory Visit
Admission: RE | Admit: 2019-11-24 | Discharge: 2019-11-24 | Disposition: A | Discharge status: Home / Self Care | Payer: Federal, State, Local not specified - PPO | Source: Ambulatory Visit | Attending: Internal Medicine | Admitting: Internal Medicine

## 2019-11-24 ENCOUNTER — Other Ambulatory Visit: Payer: Self-pay

## 2019-11-24 DIAGNOSIS — Z1231 Encounter for screening mammogram for malignant neoplasm of breast: Secondary | ICD-10-CM | POA: Diagnosis not present

## 2019-12-29 DIAGNOSIS — K080 Exfoliation of teeth due to systemic causes: Secondary | ICD-10-CM | POA: Diagnosis not present

## 2020-06-14 ENCOUNTER — Other Ambulatory Visit: Payer: Self-pay

## 2020-06-14 ENCOUNTER — Encounter: Payer: Self-pay | Admitting: Internal Medicine

## 2020-06-14 ENCOUNTER — Ambulatory Visit (INDEPENDENT_AMBULATORY_CARE_PROVIDER_SITE_OTHER): Payer: Federal, State, Local not specified - PPO | Admitting: Internal Medicine

## 2020-06-14 VITALS — BP 126/78 | HR 83 | Temp 98.7°F | Ht 65.0 in | Wt 185.2 lb

## 2020-06-14 DIAGNOSIS — E538 Deficiency of other specified B group vitamins: Secondary | ICD-10-CM | POA: Diagnosis not present

## 2020-06-14 DIAGNOSIS — E559 Vitamin D deficiency, unspecified: Secondary | ICD-10-CM

## 2020-06-14 DIAGNOSIS — Z0001 Encounter for general adult medical examination with abnormal findings: Secondary | ICD-10-CM

## 2020-06-14 DIAGNOSIS — R739 Hyperglycemia, unspecified: Secondary | ICD-10-CM

## 2020-06-14 DIAGNOSIS — E785 Hyperlipidemia, unspecified: Secondary | ICD-10-CM

## 2020-06-14 DIAGNOSIS — F418 Other specified anxiety disorders: Secondary | ICD-10-CM | POA: Diagnosis not present

## 2020-06-14 LAB — BASIC METABOLIC PANEL
BUN: 11 mg/dL (ref 6–23)
CO2: 27 mEq/L (ref 19–32)
Calcium: 9.9 mg/dL (ref 8.4–10.5)
Chloride: 104 mEq/L (ref 96–112)
Creatinine, Ser: 0.91 mg/dL (ref 0.40–1.20)
GFR: 68.62 mL/min (ref >60.00)
Glucose, Bld: 93 mg/dL (ref 70–99)
Potassium: 4.3 mEq/L (ref 3.5–5.1)
Sodium: 141 mEq/L (ref 135–145)

## 2020-06-14 LAB — URINALYSIS, ROUTINE W REFLEX MICROSCOPIC
Bilirubin Urine: NEGATIVE
Hgb urine dipstick: NEGATIVE
Ketones, ur: NEGATIVE
Leukocytes,Ua: NEGATIVE
Nitrite: NEGATIVE
RBC / HPF: NONE SEEN (ref 0–?)
Specific Gravity, Urine: 1.015 (ref 1.000–1.030)
Total Protein, Urine: NEGATIVE
Urine Glucose: NEGATIVE
Urobilinogen, UA: 0.2 (ref 0.0–1.0)
WBC, UA: NONE SEEN (ref 0–?)
pH: 7 (ref 5.0–8.0)

## 2020-06-14 LAB — CBC WITH DIFFERENTIAL/PLATELET
Basophils Absolute: 0.1 10*3/uL (ref 0.0–0.1)
Basophils Relative: 1 % (ref 0.0–3.0)
Eosinophils Absolute: 0.2 10*3/uL (ref 0.0–0.7)
Eosinophils Relative: 3.6 % (ref 0.0–5.0)
HCT: 40.5 % (ref 36.0–46.0)
Hemoglobin: 13.5 g/dL (ref 12.0–15.0)
Lymphocytes Relative: 30.8 % (ref 12.0–46.0)
Lymphs Abs: 1.5 10*3/uL (ref 0.7–4.0)
MCHC: 33.3 g/dL (ref 30.0–36.0)
MCV: 84.8 fl (ref 78.0–100.0)
Monocytes Absolute: 0.4 10*3/uL (ref 0.1–1.0)
Monocytes Relative: 8.8 % (ref 3.0–12.0)
Neutro Abs: 2.7 10*3/uL (ref 1.4–7.7)
Neutrophils Relative %: 55.8 % (ref 43.0–77.0)
Platelets: 238 10*3/uL (ref 150.0–400.0)
RBC: 4.77 Mil/uL (ref 3.87–5.11)
RDW: 14.3 % (ref 11.5–15.5)
WBC: 4.8 10*3/uL (ref 4.0–10.5)

## 2020-06-14 LAB — HEMOGLOBIN A1C: Hgb A1c MFr Bld: 5.7 % (ref 4.6–6.5)

## 2020-06-14 LAB — HEPATIC FUNCTION PANEL
ALT: 16 U/L (ref 0–35)
AST: 16 U/L (ref 0–37)
Albumin: 4.5 g/dL (ref 3.5–5.2)
Alkaline Phosphatase: 40 U/L (ref 39–117)
Bilirubin, Direct: 0.1 mg/dL (ref 0.0–0.3)
Total Bilirubin: 0.9 mg/dL (ref 0.2–1.2)
Total Protein: 7.8 g/dL (ref 6.0–8.3)

## 2020-06-14 LAB — LIPID PANEL
Cholesterol: 232 mg/dL — ABNORMAL HIGH (ref 0–200)
HDL: 41.4 mg/dL (ref >39.00)
LDL Cholesterol: 175 mg/dL — ABNORMAL HIGH (ref 0–99)
NonHDL: 190.94
Total CHOL/HDL Ratio: 6
Triglycerides: 78 mg/dL (ref 0.0–149.0)
VLDL: 15.6 mg/dL (ref 0.0–40.0)

## 2020-06-14 LAB — VITAMIN B12: Vitamin B-12: 984 pg/mL — ABNORMAL HIGH (ref 211–911)

## 2020-06-14 LAB — VITAMIN D 25 HYDROXY (VIT D DEFICIENCY, FRACTURES): VITD: 16.58 ng/mL — ABNORMAL LOW (ref 30.00–100.00)

## 2020-06-14 LAB — TSH: TSH: 2.03 u[IU]/mL (ref 0.35–4.50)

## 2020-06-14 NOTE — Progress Notes (Signed)
Patient ID: Miranda Green, female   DOB: November 10, 1960, 60 y.o.   MRN: [id number]         Chief Complaint:: wellness exam and hld, low vitd, hyperglycemia, anxiety/depression       HPI:  Miranda Green is a 60 y.o. female here for wellness exam; pt is considering shingles vax but will contact insurance first to make sure is covered; o/w up to date with prevention referrals and immunizations.                          Also working from home, work is wanting her to come back in June 2022 2 or 3 days per wk, but she is nervous and cannot physically take her laptop, printer and heavy books for work back and forth.   Left elbow is still a problem after 2019 severe fall results in neck surgury, and is not supposed to be doing any heavy lifting or contact sports since the neck surgury.  Also has recently wrosening left neck pain with radiation to below the left elbow, and chronic pain maybe worse recently to the right hip after after ligement injury 2019.  Has also right hip pain and slow to use leg abduction due to prio right hip injury and high risk for driving.  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  Trying to follow lower cholesterol diet but not always good about that, but declines statin today.  Taking Vit D 2000,   Pt denies polydipsia, polyuria, or new focal neuro s/s.     Wt Readings from Last 3 Encounters:  06/14/20 185 lb 3.2 oz (84 kg)  08/05/19 180 lb (81.6 kg)  07/07/19 182 lb (82.6 kg)   BP Readings from Last 3 Encounters:  06/14/20 126/78  08/05/19 138/79  07/07/19 122/86   Immunization History  Administered Date(s) Administered  . Influenza Inj Mdck Quad Pf 12/10/2016  . Influenza Split 12/02/2012  . Influenza,inj,Quad PF,6+ Mos 03/05/2018,  12/02/2018  . Influenza-Unspecified 12/10/2016  . Moderna SARS-COV2 Booster Vaccination 11/22/2019  . Moderna Sars-Covid-2 Vaccination 04/21/2019, 05/19/2019  . Tdap 07/01/2018  There are no preventive care reminders to display for this patient.    Past Medical History:  Diagnosis Date  . ALLERGIC RHINITIS 10/13/2006  . ANEMIA-IRON DEFICIENCY 10/13/2006   resolved after had hysterectomy  . ANXIETY 10/13/2006  . ASTHMA 10/13/2006  . Bleeds easily [facility])    Has had problems when has surgery and birthing  . DEPRESSION 10/13/2006   resolved  . Hernia of abdominal wall   . HYPERLIPIDEMIA 10/13/2006   resolved  . Migraines    Past Surgical History:  Procedure Laterality Date  . ABDOMINAL HYSTERECTOMY    . CESAREAN SECTION  1993  . NECK SURGERY Left 06/05/2010   release pressure on nerve of left shoulder    reports that she has never smoked. She has never used smokeless tobacco. She reports current alcohol use of about 2.0 standard drinks of alcohol per week. She reports that she does not use drugs. family history includes Anuerysm in her father; Breast cancer in her sister and another family member; Colon polyps in her sister; Diabetes in her brother, father, mother, and sister; Heart disease in her mother;  Hypertension in an other family member. Allergies  Allergen Reactions  . Aspirin Nausea Only   Current Outpatient Medications on File Prior to Visit  Medication Sig Dispense Refill  . acetaminophen (TYLENOL) 325 MG tablet Take 325-650 mg by mouth every 6 (six) hours as needed (for pain).     Marland Kitchen. aspirin-acetaminophen-caffeine (EXCEDRIN MIGRAINE) 250-250-65 MG tablet Take 1 tablet by mouth every 6 (six) hours as needed for headache or migraine.    . DRYSOL 20 % external solution APPLY TO AFFECTED AREA EVERY DAY AT BEDTIME 70 mL 1  . lidocaine (LIDODERM) 5 % Place 1 patch onto the skin daily. Remove & Discard patch within 12 hours or as directed by MD 30 patch 0  . Multiple  Vitamins-Minerals (MULTIVITAMIN WOMEN 50+) TABS Take 1 tablet by mouth daily.    Marland Kitchen. atorvastatin (LIPITOR) 20 MG tablet Take 1 tablet (20 mg total) by mouth daily. (Patient not taking: Reported on 06/14/2020) 90 tablet 3  . cyclobenzaprine (FLEXERIL) 10 MG tablet Take 1 tablet (10 mg total) by mouth 2 (two) times daily as needed for muscle spasms. (Patient not taking: Reported on 06/14/2020) 20 tablet 0  . Vitamin D, Ergocalciferol, (DRISDOL) 1.25 MG (50000 UNIT) CAPS capsule Take 1 capsule (50,000 Units total) by mouth every 7 (seven) days. (Patient not taking: Reported on 06/14/2020) 12 capsule 0   No current facility-administered medications on file prior to visit.        ROS:  All others reviewed and negative.  Objective        PE:  BP 126/78 (BP Location: Left Arm, Patient Position: Sitting, Cuff Size: Normal)   Pulse 83   Temp 98.7 F (37.1 C) (Oral)   Ht 5\' 5"  (1.651 m)   Wt 185 lb 3.2 oz (84 kg)   SpO2 97%   BMI 30.82 kg/m                 Constitutional: Pt appears in NAD               HENT: Head: NCAT.                Right Ear: External ear normal.                 Left Ear: External ear normal.                Eyes: . Pupils are equal, round, and reactive to light. Conjunctivae and EOM are normal               Nose: without d/c or deformity               Neck: Neck supple. Gross normal ROM               Cardiovascular: Normal rate and regular rhythm.                 Pulmonary/Chest: Effort normal and breath sounds without rales or wheezing.                Abd:  Soft, NT, ND, + BS, no organomegaly               Neurological: Pt is alert. At baseline orientation, motor grossly intact               Skin: Skin is warm. No rashes, no other new lesions, LE edema - none  Psychiatric: Pt behavior is normal without agitation  + anxoius depressed affect  Micro: none  Cardiac tracings I have personally interpreted today:  none  Pertinent Radiological findings (summarize):  none   Lab Results  Component Value Date   WBC 4.8 06/14/2020   HGB 13.5 06/14/2020   HCT 40.5 06/14/2020   PLT 238.0 06/14/2020   GLUCOSE 93 06/14/2020   CHOL 232 (H) 06/14/2020   TRIG 78.0 06/14/2020   HDL 41.40 06/14/2020   LDLCALC 175 (H) 06/14/2020   ALT 16 06/14/2020   AST 16 06/14/2020   NA 141 06/14/2020   K 4.3 06/14/2020   CL 104 06/14/2020   CREATININE 0.91 06/14/2020   BUN 11 06/14/2020   CO2 27 06/14/2020   TSH 2.03 06/14/2020   INR 0.94 05/04/2017   HGBA1C 5.7 06/14/2020   Assessment/Plan:  ADWOA AXE is a 60 y.o. Black or African American [2] female with  has a past medical history of ALLERGIC RHINITIS (10/13/2006), ANEMIA-IRON DEFICIENCY (10/13/2006), ANXIETY (10/13/2006), ASTHMA (10/13/2006), Bleeds easily (HCC), DEPRESSION (10/13/2006), Hernia of abdominal wall, HYPERLIPIDEMIA (10/13/2006), and Migraines.  Encounter for well adult exam with abnormal findings Age and sex appropriate education and counseling updated with regular exercise and diet Referrals for preventative services - none needed Immunizations addressed - pt is considering shingles Smoking counseling  - none needed Evidence for depression or other mood disorder - chronic persistent, declines change in tx or referral Most recent labs reviewed. I have personally reviewed and have noted: 1) the patient's medical and social history 2) The patient's current medications and supplements 3) The patient's height, weight, and BMI have been recorded in the chart   Hyperlipidemia Lab Results  Component Value Date   LDLCALC 175 (H) 06/14/2020   Uncontrolled,, pt to continue low chol diet, declines statin   Current Outpatient Medications (Cardiovascular):  .  atorvastatin (LIPITOR) 20 MG tablet, Take 1 tablet (20 mg total) by mouth daily. (Patient not taking: Reported on 06/14/2020)   Current Outpatient Medications (Analgesics):  .  acetaminophen (TYLENOL) 325 MG tablet, Take 325-650 mg by mouth  every 6 (six) hours as needed (for pain).  Marland Kitchen  aspirin-acetaminophen-caffeine (EXCEDRIN MIGRAINE) 250-250-65 MG tablet, Take 1 tablet by mouth every 6 (six) hours as needed for headache or migraine.   Current Outpatient Medications (Other):  Marland Kitchen  DRYSOL 20 % external solution, APPLY TO AFFECTED AREA EVERY DAY AT BEDTIME .  lidocaine (LIDODERM) 5 %, Place 1 patch onto the skin daily. Remove & Discard patch within 12 hours or as directed by MD .  Multiple Vitamins-Minerals (MULTIVITAMIN WOMEN 50+) TABS, Take 1 tablet by mouth daily. .  cyclobenzaprine (FLEXERIL) 10 MG tablet, Take 1 tablet (10 mg total) by mouth 2 (two) times daily as needed for muscle spasms. (Patient not taking: Reported on 06/14/2020) .  Vitamin D, Ergocalciferol, (DRISDOL) 1.25 MG (50000 UNIT) CAPS capsule, Take 1 capsule (50,000 Units total) by mouth every 7 (seven) days. (Patient not taking: Reported on 06/14/2020)   Hyperglycemia Lab Results  Component Value Date   HGBA1C 5.7 06/14/2020   Stable, pt to continue current medical treatment  - diet   Depression with anxiety At least mild to mod persistent, declines any change in tx or referral counseling for now  Vitamin D deficiency Last vitamin D Lab Results  Component Value Date   VD25OH 16.58 (L) 06/14/2020   Low, to start oral replacement   Followup: Return in about 1 year (around 06/14/2021).  Oliver Barre,  MD 06/18/2020 7:59 PM [facility]

## 2020-06-14 NOTE — Patient Instructions (Addendum)
Please consider the GYM at [facility] are given the work note today  Please continue all other medications as before, and refills have been done if requested.  Please have the pharmacy call with any other refills you may need.  Please continue your efforts at being more active, low cholesterol diet, and weight control.  You are otherwise up to date with prevention measures today.  Please keep your appointments with your specialists as you may have planned  Please go to the LAB at the blood drawing area for the tests to be done  You will be contacted by phone if any changes need to be made immediately.  Otherwise, you will receive a letter about your results with an explanation, but please check with MyChart first.  Please remember to sign up for MyChart if you have not done so, as this will be important to you in the future with finding out test results, communicating by private email, and scheduling acute appointments online when needed.  Please make an Appointment to return for your 1 year visit, or sooner if needed

## 2020-06-16 ENCOUNTER — Other Ambulatory Visit: Payer: Self-pay

## 2020-06-18 ENCOUNTER — Encounter: Payer: Self-pay | Admitting: Internal Medicine

## 2020-06-18 NOTE — Assessment & Plan Note (Signed)
Lab Results  Component Value Date   LDLCALC 175 (H) 06/14/2020   Uncontrolled,, pt to continue low chol diet, declines statin   Current Outpatient Medications (Cardiovascular):  .  atorvastatin (LIPITOR) 20 MG tablet, Take 1 tablet (20 mg total) by mouth daily. (Patient not taking: Reported on 06/14/2020)   Current Outpatient Medications (Analgesics):  .  acetaminophen (TYLENOL) 325 MG tablet, Take 325-650 mg by mouth every 6 (six) hours as needed (for pain).  Marland Kitchen  aspirin-acetaminophen-caffeine (EXCEDRIN MIGRAINE) 250-250-65 MG tablet, Take 1 tablet by mouth every 6 (six) hours as needed for headache or migraine.   Current Outpatient Medications (Other):  Marland Kitchen  DRYSOL 20 % external solution, APPLY TO AFFECTED AREA EVERY DAY AT BEDTIME .  lidocaine (LIDODERM) 5 %, Place 1 patch onto the skin daily. Remove & Discard patch within 12 hours or as directed by MD .  Multiple Vitamins-Minerals (MULTIVITAMIN WOMEN 50+) TABS, Take 1 tablet by mouth daily. .  cyclobenzaprine (FLEXERIL) 10 MG tablet, Take 1 tablet (10 mg total) by mouth 2 (two) times daily as needed for muscle spasms. (Patient not taking: Reported on 06/14/2020) .  Vitamin D, Ergocalciferol, (DRISDOL) 1.25 MG (50000 UNIT) CAPS capsule, Take 1 capsule (50,000 Units total) by mouth every 7 (seven) days. (Patient not taking: Reported on 06/14/2020)

## 2020-06-18 NOTE — Assessment & Plan Note (Signed)
Last vitamin D Lab Results  Component Value Date   VD25OH 16.58 (L) 06/14/2020   Low, to start oral replacement

## 2020-06-18 NOTE — Assessment & Plan Note (Signed)
At least mild to mod persistent, declines any change in tx or referral counseling for now

## 2020-06-18 NOTE — Assessment & Plan Note (Signed)
Age and sex appropriate education and counseling updated with regular exercise and diet Referrals for preventative services - none needed Immunizations addressed - pt is considering shingles Smoking counseling  - none needed Evidence for depression or other mood disorder - chronic persistent, declines change in tx or referral Most recent labs reviewed. I have personally reviewed and have noted: 1) the patient's medical and social history 2) The patient's current medications and supplements 3) The patient's height, weight, and BMI have been recorded in the chart

## 2020-06-18 NOTE — Assessment & Plan Note (Signed)
Lab Results  Component Value Date   HGBA1C 5.7 06/14/2020   Stable, pt to continue current medical treatment  - diet

## 2020-06-19 ENCOUNTER — Other Ambulatory Visit: Payer: Self-pay

## 2020-06-19 ENCOUNTER — Ambulatory Visit: Payer: Federal, State, Local not specified - PPO | Admitting: Internal Medicine

## 2020-06-19 VITALS — BP 134/88 | HR 100 | Temp 99.5°F | Ht 65.0 in | Wt 186.0 lb

## 2020-06-19 DIAGNOSIS — F418 Other specified anxiety disorders: Secondary | ICD-10-CM

## 2020-06-19 DIAGNOSIS — M542 Cervicalgia: Secondary | ICD-10-CM

## 2020-06-19 DIAGNOSIS — M2569 Stiffness of other specified joint, not elsewhere classified: Secondary | ICD-10-CM

## 2020-06-19 DIAGNOSIS — S92515A Nondisplaced fracture of proximal phalanx of left lesser toe(s), initial encounter for closed fracture: Secondary | ICD-10-CM | POA: Diagnosis not present

## 2020-06-19 DIAGNOSIS — Z23 Encounter for immunization: Secondary | ICD-10-CM | POA: Diagnosis not present

## 2020-06-19 DIAGNOSIS — R739 Hyperglycemia, unspecified: Secondary | ICD-10-CM | POA: Diagnosis not present

## 2020-06-19 DIAGNOSIS — S5002XA Contusion of left elbow, initial encounter: Secondary | ICD-10-CM | POA: Diagnosis not present

## 2020-06-19 NOTE — Patient Instructions (Signed)
You had the Shingrix shot #1 today  Please make a Nurse Visit Appt for the Shingrix Shot #2 in 2 months (or shortly after)  Your form will be filled out and you will be called to pick up  Please continue all other medications as before, and refills have been done if requested.  Please have the pharmacy call with any other refills you may need.  Please keep your appointments with your specialists as you may have planned

## 2020-06-19 NOTE — Progress Notes (Signed)
Patient ID: Miranda Green, female   DOB: 1960/04/16, 60 y.o.   MRN: [id number]        Chief Complaint:  Right hip pain       HPI:  Miranda Green is a 60 y.o. female here to c/o hx of fall on a parking deck with unfortunate "ligament damage" that was improved with PT (MRI showed tendinosis of gluteus maximus as well as sacroiliitis -dec 2019 ), did not require surgury but still tends to "lock up" with episodes sudden pain and stiffness, and simply cannot drive long distances more than a few minutes as she has to drive over 50 min to work.  Pt is asking for work accomodation  Also Incidentally tripped and broke left 5th toe per xray at ortho this am  Pt denies chest pain, increased sob or doe, wheezing, orthopnea, PND, increased LE swelling, palpitations, dizziness or syncope.   Pt denies polydipsia, polyuria, or new focal neuro s/s.  Denies worsening depressive symptoms, suicidal ideation, or panic; has ongoing anxiety  Due also for shingrix shot #2       Wt Readings from Last 3 Encounters:  06/19/20 186 lb (84.4 kg)  06/14/20 185 lb 3.2 oz (84 kg)  08/05/19 180 lb (81.6 kg)   BP Readings from Last 3 Encounters:  06/19/20 134/88  06/14/20 126/78  08/05/19 138/79         Past Medical History:  Diagnosis Date  . ALLERGIC RHINITIS 10/13/2006  . ANEMIA-IRON DEFICIENCY 10/13/2006   resolved after had hysterectomy  . ANXIETY 10/13/2006  . ASTHMA 10/13/2006  . Bleeds easily [facility])    Has had problems when has surgery and birthing  . DEPRESSION 10/13/2006   resolved  . Hernia of abdominal wall   . HYPERLIPIDEMIA 10/13/2006   resolved  . Migraines    Past Surgical History:  Procedure Laterality Date  . ABDOMINAL HYSTERECTOMY    . CESAREAN SECTION  1993  . NECK SURGERY Left 06/05/2010   release pressure on nerve of left shoulder    reports that she has never smoked. She has never used smokeless tobacco. She reports current alcohol use of about 2.0 standard drinks of alcohol per week. She  reports that she does not use drugs. family history includes Anuerysm in her father; Breast cancer in her sister and another family member; Colon polyps in her sister; Diabetes in her brother, father, mother, and sister; Heart disease in her mother; Hypertension in an other family member. Allergies  Allergen Reactions  . Aspirin Nausea Only   Current Outpatient Medications on File Prior to Visit  Medication Sig Dispense Refill  . acetaminophen (TYLENOL) 325 MG tablet Take 325-650 mg by mouth every 6 (six) hours as needed (for pain).     Marland Kitchen aspirin-acetaminophen-caffeine (EXCEDRIN MIGRAINE) 250-250-65 MG tablet Take 1 tablet by mouth every 6 (six) hours as needed for headache or migraine.    . DRYSOL 20 % external solution APPLY TO AFFECTED AREA EVERY DAY AT BEDTIME 70 mL 1  . lidocaine (LIDODERM) 5 % Place 1 patch onto the skin daily. Remove & Discard patch within 12 hours or as directed by MD 30 patch 0  . Multiple Vitamins-Minerals (MULTIVITAMIN WOMEN 50+) TABS Take 1 tablet by mouth daily.    Marland Kitchen atorvastatin (LIPITOR) 20 MG tablet Take 1 tablet (20 mg total) by mouth daily. (Patient not taking: No sig reported) 90 tablet 3  . cyclobenzaprine (FLEXERIL) 10 MG tablet Take 1 tablet (10 mg total) by  mouth 2 (two) times daily as needed for muscle spasms. (Patient not taking: No sig reported) 20 tablet 0  . Vitamin D, Ergocalciferol, (DRISDOL) 1.25 MG (50000 UNIT) CAPS capsule Take 1 capsule (50,000 Units total) by mouth every 7 (seven) days. (Patient not taking: No sig reported) 12 capsule 0   No current facility-administered medications on file prior to visit.        ROS:  All others reviewed and negative.  Objective        PE:  BP 134/88 (BP Location: Left Arm, Patient Position: Sitting, Cuff Size: Normal)   Pulse 100   Temp 99.5 F (37.5 C) (Oral)   Ht 5\' 5"  (1.651 m)   Wt 186 lb (84.4 kg)   SpO2 96%   BMI 30.95 kg/m                 Constitutional: Pt appears in NAD                HENT: Head: NCAT.                Right Ear: External ear normal.                 Left Ear: External ear normal.                Eyes: . Pupils are equal, round, and reactive to light. Conjunctivae and EOM are normal               Nose: without d/c or deformity               Neck: Neck supple. Gross normal ROM               Cardiovascular: Normal rate and regular rhythm.                 Pulmonary/Chest: Effort normal and breath sounds without rales or wheezing.                Abd:  Soft, NT, ND, + BS, no organomegaly               Neurological: Pt is alert. At baseline orientation, motor grossly intact               Skin: Skin is warm. No rashes, no other new lesions, LE edema - none               Psychiatric: Pt behavior is normal without agitation but anxious possibly depressed  Micro: none  Cardiac tracings I have personally interpreted today:  none  Pertinent Radiological findings (summarize): Feb 09 2020 MRI right hip IMPRESSION: 1. Moderate tendinosis of the right gluteus minimus insertion. 2. Right sacroiliitis with subchondral marrow edema along the inferior aspect of the joint likely secondary to osteoarthritis versus crystalline arthropathy. No SI joint effusion.    Lab Results  Component Value Date   WBC 4.8 06/14/2020   HGB 13.5 06/14/2020   HCT 40.5 06/14/2020   PLT 238.0 06/14/2020   GLUCOSE 93 06/14/2020   CHOL 232 (H) 06/14/2020   TRIG 78.0 06/14/2020   HDL 41.40 06/14/2020   LDLCALC 175 (H) 06/14/2020   ALT 16 06/14/2020   AST 16 06/14/2020   NA 141 06/14/2020   K 4.3 06/14/2020   CL 104 06/14/2020   CREATININE 0.91 06/14/2020   BUN 11 06/14/2020   CO2 27 06/14/2020   TSH 2.03 06/14/2020   INR 0.94 05/04/2017   HGBA1C 5.7  06/14/2020   Assessment/Plan:  Miranda Green is a 60 y.o. Black or African American [2] female with  has a past medical history of ALLERGIC RHINITIS (10/13/2006), ANEMIA-IRON DEFICIENCY (10/13/2006), ANXIETY (10/13/2006), ASTHMA  (10/13/2006), Bleeds easily (HCC), DEPRESSION (10/13/2006), Hernia of abdominal wall, HYPERLIPIDEMIA (10/13/2006), and Migraines.  Sacroiliac joint stiffness With recurring symptoms with longer periods of sitting driving, ok for work accomodation but may not be permanent, I encouraged pt to f/u with sport medicine  Hyperglycemia Lab Results  Component Value Date   HGBA1C 5.7 06/14/2020   Stable, pt to continue current medical treatment  - diet   Depression with anxiety Overall stable, declines need for change in tx or referral counseling at this time  Followup: Return in about 6 months (around 12/20/2020), or if symptoms worsen or fail to improve.  Oliver Barre, MD 06/26/2020 11:14 PM [facility]

## 2020-06-22 ENCOUNTER — Telehealth: Payer: Self-pay | Admitting: Internal Medicine

## 2020-06-22 NOTE — Telephone Encounter (Signed)
   Patient calling for status of work note, formal form given to provider during visit on 5/2  Please advise

## 2020-06-26 ENCOUNTER — Encounter: Payer: Self-pay | Admitting: Internal Medicine

## 2020-06-26 DIAGNOSIS — S5002XD Contusion of left elbow, subsequent encounter: Secondary | ICD-10-CM | POA: Diagnosis not present

## 2020-06-26 DIAGNOSIS — M2569 Stiffness of other specified joint, not elsewhere classified: Secondary | ICD-10-CM | POA: Insufficient documentation

## 2020-06-26 DIAGNOSIS — S92515D Nondisplaced fracture of proximal phalanx of left lesser toe(s), subsequent encounter for fracture with routine healing: Secondary | ICD-10-CM | POA: Diagnosis not present

## 2020-06-26 NOTE — Assessment & Plan Note (Signed)
Overall stable, declines need for change in tx or referral counseling at this time

## 2020-06-26 NOTE — Assessment & Plan Note (Signed)
Lab Results  Component Value Date   HGBA1C 5.7 06/14/2020   Stable, pt to continue current medical treatment  - diet

## 2020-06-26 NOTE — Assessment & Plan Note (Signed)
With recurring symptoms with longer periods of sitting driving, ok for work accomodation but may not be permanent, I encouraged pt to f/u with sport medicine

## 2020-06-26 NOTE — Telephone Encounter (Signed)
Patient called and is requesting a call back in regards to a work note. She can be reached at [phone]

## 2020-06-26 NOTE — Telephone Encounter (Signed)
Sorry I was unable to get this finished since her visit, but will hopefully get done today

## 2020-06-27 NOTE — Telephone Encounter (Signed)
Paperwork has been completed and patient notified to pick it up.

## 2020-07-12 ENCOUNTER — Encounter: Payer: Federal, State, Local not specified - PPO | Admitting: Internal Medicine

## 2020-07-19 DIAGNOSIS — M7712 Lateral epicondylitis, left elbow: Secondary | ICD-10-CM | POA: Diagnosis not present

## 2020-07-19 DIAGNOSIS — M5431 Sciatica, right side: Secondary | ICD-10-CM | POA: Diagnosis not present

## 2020-07-19 DIAGNOSIS — S92515D Nondisplaced fracture of proximal phalanx of left lesser toe(s), subsequent encounter for fracture with routine healing: Secondary | ICD-10-CM | POA: Diagnosis not present

## 2020-07-20 ENCOUNTER — Ambulatory Visit: Payer: Federal, State, Local not specified - PPO | Admitting: Internal Medicine

## 2020-07-21 ENCOUNTER — Ambulatory Visit: Payer: Federal, State, Local not specified - PPO | Admitting: Specialist

## 2020-07-21 ENCOUNTER — Other Ambulatory Visit: Payer: Self-pay

## 2020-07-21 ENCOUNTER — Ambulatory Visit: Payer: Self-pay

## 2020-07-21 ENCOUNTER — Encounter: Payer: Self-pay | Admitting: Specialist

## 2020-07-21 ENCOUNTER — Ambulatory Visit (INDEPENDENT_AMBULATORY_CARE_PROVIDER_SITE_OTHER): Payer: Federal, State, Local not specified - PPO

## 2020-07-21 VITALS — BP 126/76 | HR 71 | Ht 65.0 in | Wt 186.0 lb

## 2020-07-21 DIAGNOSIS — M4316 Spondylolisthesis, lumbar region: Secondary | ICD-10-CM

## 2020-07-21 DIAGNOSIS — R29898 Other symptoms and signs involving the musculoskeletal system: Secondary | ICD-10-CM

## 2020-07-21 DIAGNOSIS — Z9181 History of falling: Secondary | ICD-10-CM

## 2020-07-21 DIAGNOSIS — M5416 Radiculopathy, lumbar region: Secondary | ICD-10-CM

## 2020-07-21 DIAGNOSIS — M25551 Pain in right hip: Secondary | ICD-10-CM | POA: Diagnosis not present

## 2020-07-21 DIAGNOSIS — M25559 Pain in unspecified hip: Secondary | ICD-10-CM

## 2020-07-21 NOTE — Progress Notes (Addendum)
Office Visit Note   Patient: Miranda Green           Date of Birth: 03-Jul-1960           MRN: [id number] Visit Date: 07/21/2020              Requested by: Corwin Levins, MD [address] [place],  [place] [address] PCP: Corwin Levins, MD   Assessment & Plan: Visit Diagnoses:  1. Hip pain   2. Spondylolisthesis of lumbar region   3. Weakness of right leg     Plan:  Fall Prevention and Home Safety Falls cause injuries and can affect all age groups. It is possible to use preventive measures to significantly decrease the likelihood of falls. There are many simple measures which can make your home safer and prevent falls. OUTDOORS Repair cracks and edges of walkways and driveways. Remove high doorway thresholds. Trim shrubbery on the main path into your home. Have good outside lighting. Clear walkways of tools, rocks, debris, and clutter. Check that handrails are not broken and are securely fastened. Both sides of steps should have handrails. Have leaves, snow, and ice cleared regularly. Use sand or salt on walkways during winter months. In the garage, clean up grease or oil spills. BATHROOM Install night lights. Install grab bars by the toilet and in the tub and shower. Use non-skid mats or decals in the tub or shower. Place a plastic non-slip stool in the shower to sit on, if needed. Keep floors dry and clean up all water on the floor immediately. Remove soap buildup in the tub or shower on a regular basis. Secure bath mats with non-slip, double-sided rug tape. Remove throw rugs and tripping hazards from the floors. BEDROOMS Install night lights. Make sure a bedside light is easy to reach. Do not use oversized bedding. Keep a telephone by your bedside. Have a firm chair with side arms to use for getting dressed. Remove throw rugs and tripping hazards from the floor. KITCHEN Keep handles on pots and pans turned toward the center of the stove. Use back burners when  possible. Clean up spills quickly and allow time for drying. Avoid walking on wet floors. Avoid hot utensils and knives. Position shelves so they are not too high or low. Place commonly used objects within easy reach. If necessary, use a sturdy step stool with a grab bar when reaching. Keep electrical cables out of the way. Do not use floor polish or wax that makes floors slippery. If you must use wax, use non-skid floor wax. Remove throw rugs and tripping hazards from the floor. STAIRWAYS Never leave objects on stairs. Place handrails on both sides of stairways and use them. Fix any loose handrails. Make sure handrails on both sides of the stairways are as long as the stairs. Check carpeting to make sure it is firmly attached along stairs. Make repairs to worn or loose carpet promptly. Avoid placing throw rugs at the top or bottom of stairways, or properly secure the rug with carpet tape to prevent slippage. Get rid of throw rugs, if possible. Have an electrician put in a light switch at the top and bottom of the stairs. OTHER FALL PREVENTION TIPS Wear low-heel or rubber-soled shoes that are supportive and fit well. Wear closed toe shoes. When using a stepladder, make sure it is fully opened and both spreaders are firmly locked. Do not climb a closed stepladder. Add color or contrast paint or tape to grab bars  and handrails in your home. Place contrasting color strips on first and last steps. Learn and use mobility aids as needed. Install an electrical emergency response system. Turn on lights to avoid dark areas. Replace light bulbs that burn out immediately. Get light switches that glow. Arrange furniture to create clear pathways. Keep furniture in the same place. Firmly attach carpet with non-skid or double-sided tape. Eliminate uneven floor surfaces. Select a carpet pattern that does not visually hide the edge of steps. Be aware of all pets. OTHER HOME SAFETY TIPS Set the water  temperature for 120 F (48.8 C). Keep emergency numbers on or near the telephone. Keep smoke detectors on every level of the home and near sleeping areas. Document Released: 01/25/2002 Document Revised: 08/06/2011 Document Reviewed: 04/26/2011 [facility] Patient Information 2014 [place], [place]. Right leg EMG/NCV right leg due to weakness and right leg giving away causing her to fall and to injure her left toe.      Instructions   Return in about 4 weeks (around 08/18/2020).  Follow-Up Instructions: Return in about 4 weeks (around 08/18/2020).   Orders:  Orders Placed This Encounter  Procedures   XR HIP UNILAT W OR W/O PELVIS 2-3 VIEWS RIGHT   XR Lumbar Spine 2-3 Views   Ambulatory referral to Physical Medicine Rehab   No orders of the defined types were placed in this encounter.     Procedures: No procedures performed   Clinical Data: No additional findings.   Subjective: Chief Complaint  Patient presents with   Right Hip - Pain    60 year old female with history of right hip pain that started with a fall down 4 flights of stairs at the [facility] parking deck near the [facility]. She took the first step and she reports the step was slick and icey cold and rainey. They were painting the steps and she fell and kept rolling down the stairs. She had a nurse coming into work help her and they placed her in a wheelchair and was initiallyseen at  and she had an xray and her husband was undergoing an ablation of an area of the hears. Bending causes a locking sensation. Right hip will lock on her when she goes to standing. Reports Wednsday night it was painful to touch even with the covers to touch it. She reports that the right leg gives away.  No bowel or badder difficulty. She reports that she can not bend and can not walk  Too farr.    Review of Systems  Constitutional: Positive for unexpected weight change (Weight gained about 10 lbs.). Negative for activity change, appetite  change, chills, diaphoresis, fatigue and fever.  HENT: Negative for congestion, dental problem, drooling, ear discharge, ear pain, facial swelling, hearing loss, mouth sores, nosebleeds, postnasal drip, rhinorrhea, sinus pressure, sinus pain, sneezing, sore throat, tinnitus, trouble swallowing and voice change.   Eyes: Negative.  Negative for photophobia, pain, discharge, redness, itching and visual disturbance.  Respiratory: Negative.   Cardiovascular: Positive for chest pain. Negative for palpitations and leg swelling.  Gastrointestinal: Negative.  Negative for abdominal distention, abdominal pain, anal bleeding, blood in stool, constipation, diarrhea, nausea, rectal pain and vomiting.  Endocrine: Negative.  Negative for cold intolerance, heat intolerance, polydipsia, polyphagia and polyuria.  Genitourinary: Positive for frequency. Negative for difficulty urinating, dyspareunia, dysuria, enuresis and flank pain.  Musculoskeletal: Positive for arthralgias, back pain and gait problem. Negative for joint swelling, myalgias, neck pain and neck stiffness.  Skin: Negative.  Neurological: Positive for weakness and numbness.  Hematological: Does not bruise/bleed easily.  Psychiatric/Behavioral: Negative for agitation, behavioral problems, confusion, decreased concentration, dysphoric mood, hallucinations, self-injury, sleep disturbance and suicidal ideas. The patient is not nervous/anxious and is not hyperactive.      Objective: Vital Signs: BP 126/76   Pulse 71   Ht 5\' 5"  (1.651 m)   Wt 186 lb (84.4 kg)   BMI 30.95 kg/m   Physical Exam  Back Exam   Tenderness  The patient is experiencing tenderness in the lumbar.  Muscle Strength  Right Quadriceps:  4/5  Left Quadriceps:  5/5  Right Hamstrings:  5/5  Left Hamstrings:  5/5   Comments:  Right hip flexion weakness 4/5, right knee weakness 4/5 Left foot toe fracture, reportedly broke the left toe because the right hip gave away and she  well over the right side.       Specialty Comments:  No specialty comments available.  Imaging: No results found.   PMFS History: Patient Active Problem List   Diagnosis Date Noted   Angioedema 03/20/2021   Sacroiliac joint stiffness 06/26/2020   Vitamin D deficiency 07/10/2019   Hyperglycemia 07/10/2019   Syncope 05/27/2019   Concussion with no loss of consciousness 04/30/2017   Neck pain 04/30/2017   Hot flashes 08/04/2016   Abnormal auditory perception of right ear 07/29/2016   Impacted cerumen of both ears 07/29/2016   Pulsatile tinnitus 07/29/2016   Acute hearing loss, right 06/19/2016   Folliculitis 06/19/2016   Chest pain 10/07/2014   Migraine 06/26/2013   Rash 08/18/2011   Encounter for well adult exam with abnormal findings 02/05/2011   CERVICAL RADICULOPATHY, LEFT 04/05/2010   BREAST CYST, RIGHT 03/19/2010   PALPITATIONS, RECURRENT 03/16/2010   FREQUENCY, URINARY 03/16/2010   SHOULDER PAIN, LEFT 11/10/2009   ELBOW PAIN, RIGHT 11/10/2009   SUPERFICIAL THROMBOPHLEBITIS 05/04/2007   Hyperlipidemia 10/13/2006   ANEMIA-IRON DEFICIENCY 10/13/2006   ANXIETY 10/13/2006   Depression with anxiety 10/13/2006   ALLERGIC RHINITIS 10/13/2006   Asthma 10/13/2006   COLONIC POLYPS, HX OF 10/13/2006   OBESITY 10/07/2006   Past Medical History:  Diagnosis Date   ALLERGIC RHINITIS 10/13/2006   ANEMIA-IRON DEFICIENCY 10/13/2006   resolved after had hysterectomy   ANXIETY 10/13/2006   ASTHMA 10/13/2006   Bleeds easily (HCC)    Has had problems when has surgery and birthing   DEPRESSION 10/13/2006   resolved   Hernia of abdominal wall    HYPERLIPIDEMIA 10/13/2006   resolved   Migraines     Family History  Problem Relation Age of Onset   Hypertension Other        entire family   Colon polyps Sister    Breast cancer Sister    Diabetes Sister        x 6   Heart disease Mother    Diabetes Mother    Anuerysm Father        brain   Diabetes Father    Breast cancer  Other    Diabetes Brother        x 2   Colon cancer Neg Hx    Esophageal cancer Neg Hx    Stomach cancer Neg Hx    Rectal cancer Neg Hx     Past Surgical History:  Procedure Laterality Date   ABDOMINAL HYSTERECTOMY     CESAREAN SECTION  1993   NECK SURGERY Left 06/05/2010   release pressure on nerve of left shoulder   Social History  Occupational History   Occupation: bankrupcy specialist  Tobacco Use   Smoking status: Never   Smokeless tobacco: Never  Substance and Sexual Activity   Alcohol use: Yes    Alcohol/week: 2.0 standard drinks    Types: 2 Glasses of wine per week   Drug use: No   Sexual activity: Not on file

## 2020-07-21 NOTE — Patient Instructions (Signed)
Fall Prevention and Home Safety Falls cause injuries and can affect all age groups. It is possible to use preventive measures to significantly decrease the likelihood of falls. There are many simple measures which can make your home safer and prevent falls. OUTDOORS  Repair cracks and edges of walkways and driveways.  Remove high doorway thresholds.  Trim shrubbery on the main path into your home.  Have good outside lighting.  Clear walkways of tools, rocks, debris, and clutter.  Check that handrails are not broken and are securely fastened. Both sides of steps should have handrails.  Have leaves, snow, and ice cleared regularly.  Use sand or salt on walkways during winter months.  In the garage, clean up grease or oil spills. BATHROOM  Install night lights.  Install grab bars by the toilet and in the tub and shower.  Use non-skid mats or decals in the tub or shower.  Place a plastic non-slip stool in the shower to sit on, if needed.  Keep floors dry and clean up all water on the floor immediately.  Remove soap buildup in the tub or shower on a regular basis.  Secure bath mats with non-slip, double-sided rug tape.  Remove throw rugs and tripping hazards from the floors. BEDROOMS  Install night lights.  Make sure a bedside light is easy to reach.  Do not use oversized bedding.  Keep a telephone by your bedside.  Have a firm chair with side arms to use for getting dressed.  Remove throw rugs and tripping hazards from the floor. KITCHEN  Keep handles on pots and pans turned toward the center of the stove. Use back burners when possible.  Clean up spills quickly and allow time for drying.  Avoid walking on wet floors.  Avoid hot utensils and knives.  Position shelves so they are not too high or low.  Place commonly used objects within easy reach.  If necessary, use a sturdy step stool with a grab bar when reaching.  Keep electrical cables out of the  way.  Do not use floor polish or wax that makes floors slippery. If you must use wax, use non-skid floor wax.  Remove throw rugs and tripping hazards from the floor. STAIRWAYS  Never leave objects on stairs.  Place handrails on both sides of stairways and use them. Fix any loose handrails. Make sure handrails on both sides of the stairways are as long as the stairs.  Check carpeting to make sure it is firmly attached along stairs. Make repairs to worn or loose carpet promptly.  Avoid placing throw rugs at the top or bottom of stairways, or properly secure the rug with carpet tape to prevent slippage. Get rid of throw rugs, if possible.  Have an electrician put in a light switch at the top and bottom of the stairs. OTHER FALL PREVENTION TIPS  Wear low-heel or rubber-soled shoes that are supportive and fit well. Wear closed toe shoes.  When using a stepladder, make sure it is fully opened and both spreaders are firmly locked. Do not climb a closed stepladder.  Add color or contrast paint or tape to grab bars and handrails in your home. Place contrasting color strips on first and last steps.  Learn and use mobility aids as needed. Install an electrical emergency response system.  Turn on lights to avoid dark areas. Replace light bulbs that burn out immediately. Get light switches that glow.  Arrange furniture to create clear pathways. Keep furniture in the same place.    Firmly attach carpet with non-skid or double-sided tape.  Eliminate uneven floor surfaces.  Select a carpet pattern that does not visually hide the edge of steps.  Be aware of all pets. OTHER HOME SAFETY TIPS  Set the water temperature for 120 F (48.8 C).  Keep emergency numbers on or near the telephone.  Keep smoke detectors on every level of the home and near sleeping areas. Document Released: 01/25/2002 Document Revised: 08/06/2011 Document Reviewed: 04/26/2011 [facility] Patient Information 2014  [place], [place]. Right leg EMG/NCV right leg due to weakness and right leg giving away causing her to fall and to injure her left toe.

## 2020-08-09 DIAGNOSIS — M7712 Lateral epicondylitis, left elbow: Secondary | ICD-10-CM | POA: Diagnosis not present

## 2020-08-09 DIAGNOSIS — S92515D Nondisplaced fracture of proximal phalanx of left lesser toe(s), subsequent encounter for fracture with routine healing: Secondary | ICD-10-CM | POA: Diagnosis not present

## 2020-08-25 ENCOUNTER — Ambulatory Visit: Payer: Federal, State, Local not specified - PPO | Admitting: Specialist

## 2020-10-31 ENCOUNTER — Other Ambulatory Visit: Payer: Self-pay | Admitting: Internal Medicine

## 2020-10-31 DIAGNOSIS — Z1231 Encounter for screening mammogram for malignant neoplasm of breast: Secondary | ICD-10-CM

## 2020-11-09 IMAGING — MR MR HIP*R* W/O CM
5 series · 40 of 40 positions shown · non-contrast
Comparison: None.

CLINICAL DATA: Right hip pain.  Pain with walking and standing.

EXAM:
MR OF THE RIGHT HIP WITHOUT CONTRAST
TECHNIQUE: Multiplanar, multisequence MR imaging was performed. No intravenous
contrast was administered.

[Series 3: T1 · coronal · 4.0mm · 1.41mm/px · 9 of 34 slices shown]
[im 1/34]
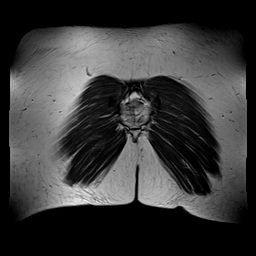
[im 5/34]
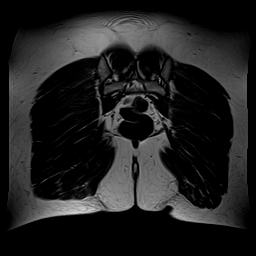
[im 9/34]
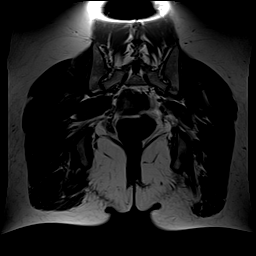
[im 13/34]
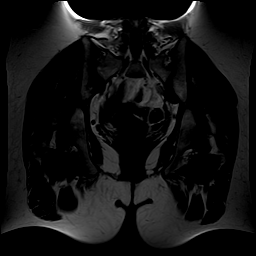
[im 17/34]
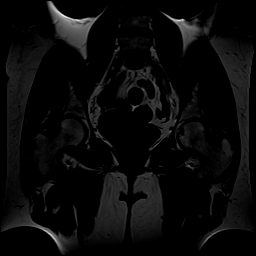
[im 21/34]
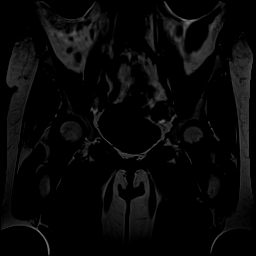
[im 25/34]
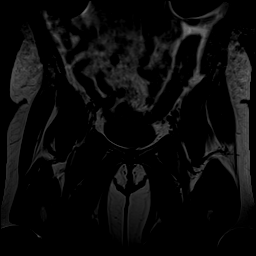
[im 29/34]
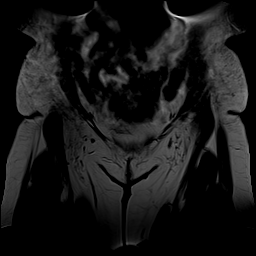
[im 34/34]
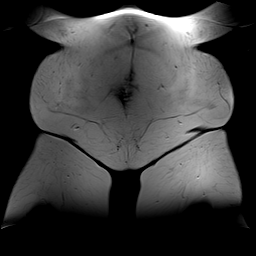

[Series 4: STIR · coronal · 4.0mm · 1.41mm/px · 9 of 34 slices shown]
[im 1/34]
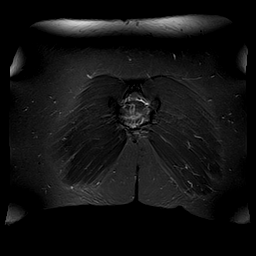
[im 5/34]
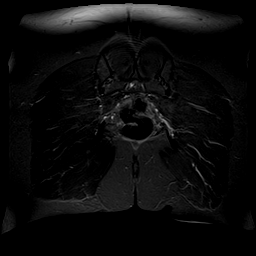
[im 9/34]
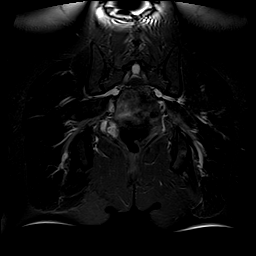
[im 13/34]
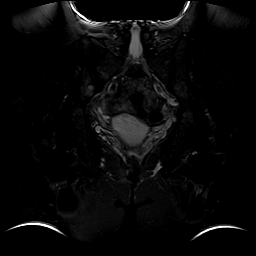
[im 17/34]
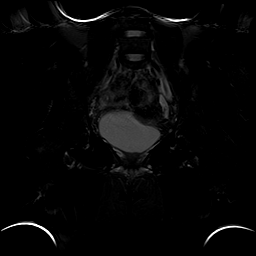
[im 21/34]
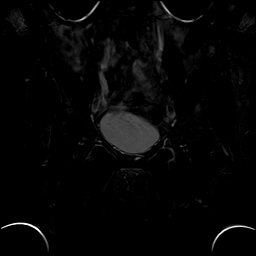
[im 25/34]
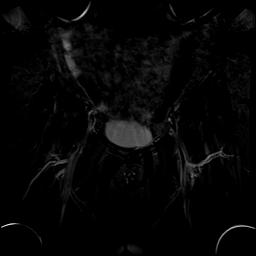
[im 29/34]
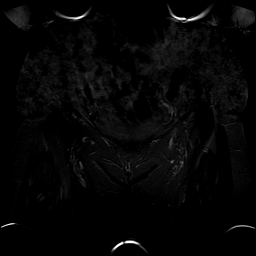
[im 34/34]
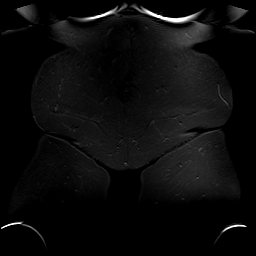

[Series 5: T2 fat-sat · axial · 4.0mm · 0.70mm/px · z∈[-107,+23]mm · 7 of 27 slices shown]
[im 1/27]
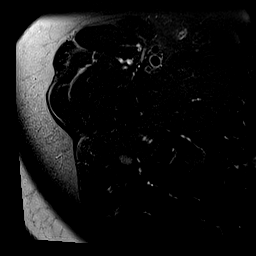
[im 5/27]
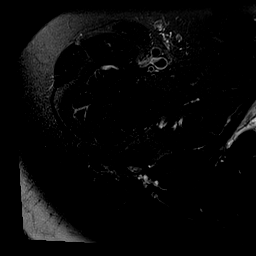
[im 9/27]
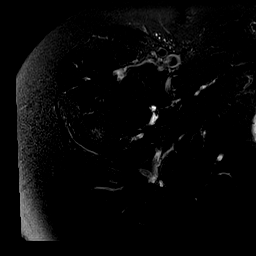
[im 14/27]
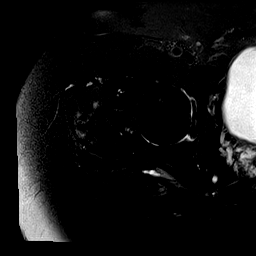
[im 18/27]
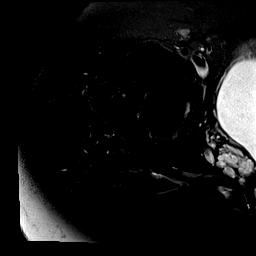
[im 22/27]
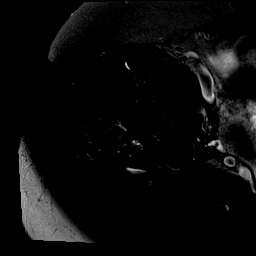
[im 27/27]
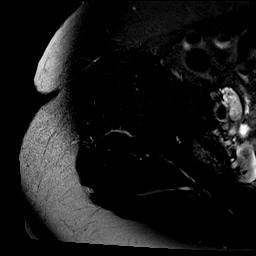

[Series 6: PD fat-sat · sagittal · 4.0mm · 0.70mm/px · 7 of 26 slices shown (1 of 2)]
[im 1/26]
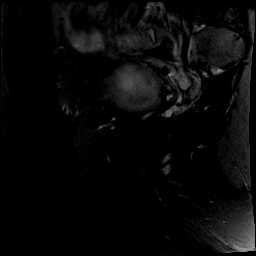
[im 5/26]
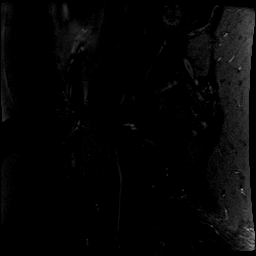
[im 9/26]
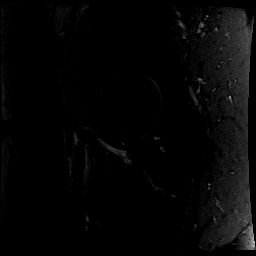
[im 13/26]
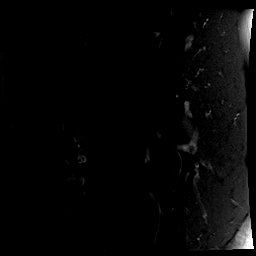
[im 17/26]
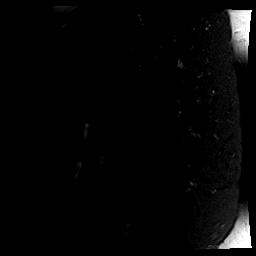
[im 21/26]
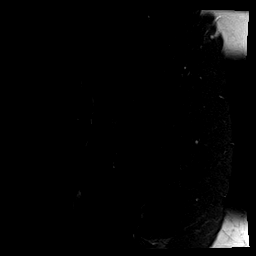
[im 26/26]
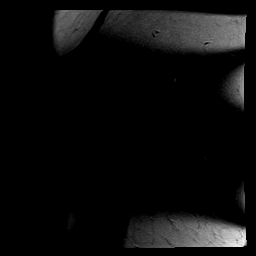

[Series 7: PD fat-sat · coronal · 4.0mm · 0.70mm/px · 8 of 28 slices shown (2 of 2)]
[im 1/28]
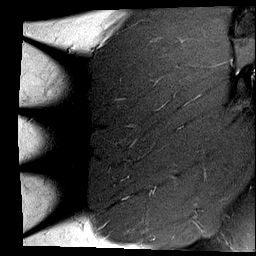
[im 4/28]
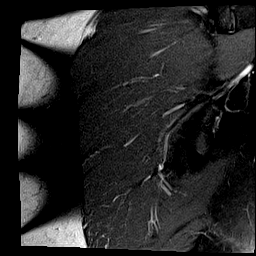
[im 8/28]
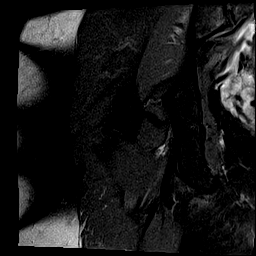
[im 12/28]
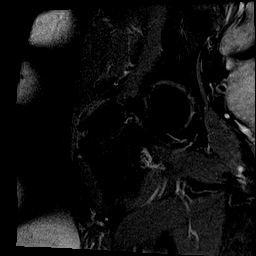
[im 16/28]
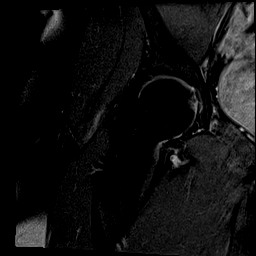
[im 20/28]
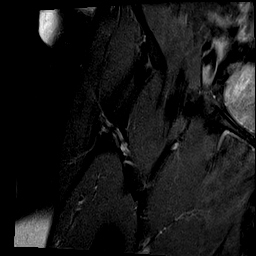
[im 24/28]
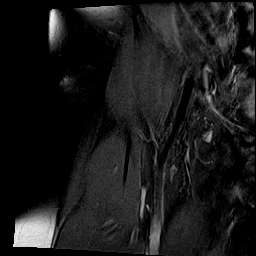
[im 28/28]
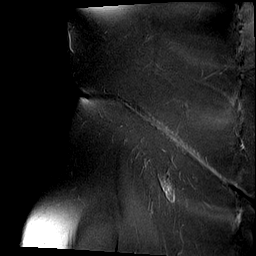

[40 of 40 positions shown; findings below may reference images not displayed]

FINDINGS: Bones: No hip fracture, dislocation or avascular necrosis. No
periosteal reaction or bone destruction. No aggressive osseous
lesion.

Left SI joint is normal without widening or erosive changes. No
right SI joint widening or erosive changes. Subchondral marrow edema
on either side of the right sacroiliac joint along the inferior
aspect most concerning for sacroiliitis.

Articular cartilage and labrum

Articular cartilage:  No chondral defect of the right hip.

Labrum:  Right superior labral degeneration.

Joint or bursal effusion

Joint effusion:  No hip joint effusion.  No SI joint effusion.

Bursae:  No bursa formation.

Muscles and tendons

Flexors: Normal.

Extensors: Normal.

Abductors: Normal.

Adductors: Normal.

Gluteals: Moderate tendinosis of the right gluteus minimus
insertion.

Hamstrings: Normal.

Other findings

Miscellaneous: No pelvic free fluid. No fluid collection or
hematoma. No inguinal lymphadenopathy. No inguinal hernia.
IMPRESSION: 1. Moderate tendinosis of the right gluteus minimus insertion.
2. Right sacroiliitis with subchondral marrow edema along the
inferior aspect of the joint likely secondary to osteoarthritis
versus crystalline arthropathy. No SI joint effusion.

## 2020-12-06 ENCOUNTER — Ambulatory Visit
Admission: RE | Admit: 2020-12-06 | Discharge: 2020-12-06 | Disposition: A | Discharge status: Home / Self Care | Payer: Federal, State, Local not specified - PPO | Source: Ambulatory Visit | Attending: Internal Medicine | Admitting: Internal Medicine

## 2020-12-06 ENCOUNTER — Other Ambulatory Visit: Payer: Self-pay

## 2020-12-06 DIAGNOSIS — Z1231 Encounter for screening mammogram for malignant neoplasm of breast: Secondary | ICD-10-CM | POA: Diagnosis not present

## 2021-03-20 ENCOUNTER — Ambulatory Visit: Payer: Federal, State, Local not specified - PPO | Admitting: Internal Medicine

## 2021-03-20 ENCOUNTER — Other Ambulatory Visit: Payer: Self-pay

## 2021-03-20 ENCOUNTER — Encounter: Payer: Self-pay | Admitting: Internal Medicine

## 2021-03-20 VITALS — BP 118/68 | HR 68 | Temp 98.7°F | Ht 65.0 in | Wt 183.0 lb

## 2021-03-20 DIAGNOSIS — R739 Hyperglycemia, unspecified: Secondary | ICD-10-CM | POA: Diagnosis not present

## 2021-03-20 DIAGNOSIS — Z23 Encounter for immunization: Secondary | ICD-10-CM | POA: Diagnosis not present

## 2021-03-20 DIAGNOSIS — E559 Vitamin D deficiency, unspecified: Secondary | ICD-10-CM | POA: Diagnosis not present

## 2021-03-20 DIAGNOSIS — T783XXA Angioneurotic edema, initial encounter: Secondary | ICD-10-CM | POA: Diagnosis not present

## 2021-03-20 DIAGNOSIS — E7800 Pure hypercholesterolemia, unspecified: Secondary | ICD-10-CM | POA: Diagnosis not present

## 2021-03-20 MED ORDER — FEXOFENADINE HCL 180 MG PO TABS: 180.0000 mg | ORAL_TABLET | Freq: Every day | ORAL | 11 refills | Status: DC

## 2021-03-20 MED ORDER — EPINEPHRINE 0.3 MG/0.3ML IJ SOAJ: 0.3000 mg | INTRAMUSCULAR | 3 refills | Status: AC | PRN

## 2021-03-20 MED ORDER — TRIAMCINOLONE ACETONIDE 55 MCG/ACT NA AERO: 2.0000 | INHALATION_SPRAY | Freq: Every day | NASAL | 12 refills | Status: DC

## 2021-03-20 MED ORDER — PREDNISONE 10 MG PO TABS: ORAL_TABLET | ORAL | 3 refills | Status: DC

## 2021-03-20 MED ORDER — ALBUTEROL SULFATE HFA 108 (90 BASE) MCG/ACT IN AERS: 2.0000 | INHALATION_SPRAY | Freq: Four times a day (QID) | RESPIRATORY_TRACT | 11 refills | Status: AC | PRN

## 2021-03-20 NOTE — Progress Notes (Signed)
Patient ID: Miranda Green, female   DOB: Dec 20, 1960, 61 y.o.   MRN: [id number]        Chief Complaint: follow up facial swelling x 4 days       HPI:  Miranda Green is a 61 y.o. female here with c/o sudden onset rather dramatic bilateral face, lips and eyelid swelling after handling the dog running in the backyard with trees and grasses then remembers rubbing her both sides with both hands.  Within minutes had moderate to severe bilateral swelling as above but also some throat swelling and itching as well (has picture on her phone)  Took benadryl 50 mg every 6 hrs for about 4 doses and seemed to calm somewhat and not keep getting worse, so stopped this.  Struggled about the same over the weekend and yesterday and today with some steady improvement.  Still has some feeling of throat swelling like lumps in the throat on swallowing but no pain, fever, and seems to breathe, swallow ok.  Not taking Vit D. Not taking lipitor - working on on diet instead.   Pt denies fever, wt loss, night sweats, loss of appetite, or other constitutional symptoms        Wt Readings from Last 3 Encounters:  03/20/21 183 lb (83 kg)  07/21/20 186 lb (84.4 kg)  06/19/20 186 lb (84.4 kg)   BP Readings from Last 3 Encounters:  03/20/21 118/68  07/21/20 126/76  06/19/20 134/88         Past Medical History:  Diagnosis Date   ALLERGIC RHINITIS 10/13/2006   ANEMIA-IRON DEFICIENCY 10/13/2006   resolved after had hysterectomy   ANXIETY 10/13/2006   ASTHMA 10/13/2006   Bleeds easily (HCC)    Has had problems when has surgery and birthing   DEPRESSION 10/13/2006   resolved   Hernia of abdominal wall    HYPERLIPIDEMIA 10/13/2006   resolved   Migraines    Past Surgical History:  Procedure Laterality Date   ABDOMINAL HYSTERECTOMY     CESAREAN SECTION  1993   NECK SURGERY Left 06/05/2010   release pressure on nerve of left shoulder    reports that she has never smoked. She has never used smokeless tobacco. She reports  current alcohol use of about 2.0 standard drinks per week. She reports that she does not use drugs. family history includes Anuerysm in her father; Breast cancer in her sister and another family member; Colon polyps in her sister; Diabetes in her brother, father, mother, and sister; Heart disease in her mother; Hypertension in an other family member. Allergies  Allergen Reactions   Aspirin Nausea Only   Current Outpatient Medications on File Prior to Visit  Medication Sig Dispense Refill   acetaminophen (TYLENOL) 325 MG tablet Take 325-650 mg by mouth every 6 (six) hours as needed (for pain).      aspirin-acetaminophen-caffeine (EXCEDRIN MIGRAINE) 250-250-65 MG tablet Take 1 tablet by mouth every 6 (six) hours as needed for headache or migraine.     DRYSOL 20 % external solution APPLY TO AFFECTED AREA EVERY DAY AT BEDTIME 70 mL 1   lidocaine (LIDODERM) 5 % Place 1 patch onto the skin daily. Remove & Discard patch within 12 hours or as directed by MD 30 patch 0   Multiple Vitamins-Minerals (MULTIVITAMIN WOMEN 50+) TABS Take 1 tablet by mouth daily.     No current facility-administered medications on file prior to visit.        ROS:  All others reviewed and  negative.  Objective        PE:  BP 118/68 (BP Location: Left Arm, Patient Position: Sitting, Cuff Size: Large)    Pulse 68    Temp 98.7 F (37.1 C) (Oral)    Ht 5\' 5"  (1.651 m)    Wt 183 lb (83 kg)    SpO2 99%    BMI 30.45 kg/m                 Constitutional: Pt appears in NAD               HENT: Head: NCAT.                Right Ear: External ear normal.                 Left Ear: External ear normal.                Eyes: . Pupils are equal, round, and reactive to light. Conjunctivae and EOM are normal               Nose: without d/c or deformity               Neck: Neck supple. Gross normal ROM; throat without visible swelling               Cardiovascular: Normal rate and regular rhythm.                 Pulmonary/Chest: Effort  normal and breath sounds without rales or wheezing.                Abd:  Soft, NT, ND, + BS, no organomegaly               Neurological: Pt is alert. At baseline orientation, motor grossly intact               Skin: Skin is warm. No rashes, no other new lesions, LE edema - none               Psychiatric: Pt behavior is normal without agitation   Micro: none  Cardiac tracings I have personally interpreted today:  none  Pertinent Radiological findings (summarize): none   Lab Results  Component Value Date   WBC 4.8 06/14/2020   HGB 13.5 06/14/2020   HCT 40.5 06/14/2020   PLT 238.0 06/14/2020   GLUCOSE 93 06/14/2020   CHOL 232 (H) 06/14/2020   TRIG 78.0 06/14/2020   HDL 41.40 06/14/2020   LDLCALC 175 (H) 06/14/2020   ALT 16 06/14/2020   AST 16 06/14/2020   NA 141 06/14/2020   K 4.3 06/14/2020   CL 104 06/14/2020   CREATININE 0.91 06/14/2020   BUN 11 06/14/2020   CO2 27 06/14/2020   TSH 2.03 06/14/2020   INR 0.94 05/04/2017   HGBA1C 5.7 06/14/2020   Assessment/Plan:  [name] is a 61 y.o. Black or African American [2] female with  has a past medical history of ALLERGIC RHINITIS (10/13/2006), ANEMIA-IRON DEFICIENCY (10/13/2006), ANXIETY (10/13/2006), ASTHMA (10/13/2006), Bleeds easily (HCC), DEPRESSION (10/13/2006), Hernia of abdominal wall, HYPERLIPIDEMIA (10/13/2006), and Migraines.  Angioedema Now fortunately improved with benadryl otc, exam benign today, but given may recur will tx with allegra, nasacort asda, prednisone 5 days prn, epi-pen, inhaler prh, declines allergy referral for now, will also check food allergy panel and [facility] resp allergy panel  Hyperglycemia Lab Results  Component Value Date   HGBA1C 5.7 06/14/2020   Stable, pt  to continue current medical treatment  - diet   Hyperlipidemia Lab Results  Component Value Date   LDLCALC 175 (H) 06/14/2020   Severe uncontrolled, pt declines lipitor, to cont low chol diet   Vitamin D deficiency Last vitamin  D Lab Results  Component Value Date   VD25OH 16.58 (L) 06/14/2020   Low, to start oral replacement  Followup: Return in about 3 months (around 06/17/2021).  Oliver Barre, MD 03/20/2021 6:50 PM [facility]

## 2021-03-20 NOTE — Patient Instructions (Addendum)
Please take all new medication as prescribed  - the allegra and nasacort allergy symptoms, prednisone as needed for further facial swelling or breathing attacks, the albuterol inhaler as needed, and Epi-pen as needed  Please continue all other medications as before, and refills have been done if requested.  Please have the pharmacy call with any other refills you may need.  Please keep your appointments with your specialists as you may have planned  Please go to the LAB at the blood drawing area for the tests to be done - for the blood allergy testing  Please make an Appointment to return in 3 months, or sooner if needed, also with Lab Appointment for testing done 3-5 days before at the [facility] (so this is for TWO appointments - please see the scheduling desk as you leave)  Due to the ongoing Covid 19 pandemic, our lab now requires an appointment for any labs done at our office.  If you need labs done and do not have an appointment, please call our office ahead of time to schedule before presenting to the lab for your testing.

## 2021-03-20 NOTE — Assessment & Plan Note (Signed)
Lab Results  Component Value Date   LDLCALC 175 (H) 06/14/2020   Severe uncontrolled, pt declines lipitor, to cont low chol diet

## 2021-03-20 NOTE — Assessment & Plan Note (Signed)
Last vitamin D Lab Results  Component Value Date   VD25OH 16.58 (L) 06/14/2020   Low, to start oral replacement  

## 2021-03-20 NOTE — Assessment & Plan Note (Signed)
Now fortunately improved with benadryl otc, exam benign today, but given may recur will tx with allegra, nasacort asda, prednisone 5 days prn, epi-pen, inhaler prh, declines allergy referral for now, will also check food allergy panel and [facility] resp allergy panel

## 2021-03-20 NOTE — Assessment & Plan Note (Signed)
Lab Results  Component Value Date   HGBA1C 5.7 06/14/2020   Stable, pt to continue current medical treatment  - diet  

## 2021-03-21 LAB — FOOD ALLERGY PROFILE
Allergen, Salmon, f41: <0.1 kU/L
Almonds: <0.1 kU/L
CLASS: 0
CLASS: 0
CLASS: 0
CLASS: 0
CLASS: 0
CLASS: 0
CLASS: 0
CLASS: 0
CLASS: 0
CLASS: 0
CLASS: 0
Cashew IgE: <0.1 kU/L
Class: 0
Class: 0
Class: 0
Class: 0
Egg White IgE: <0.1 kU/L
Fish Cod: <0.1 kU/L
Hazelnut: <0.1 kU/L
Milk IgE: <0.1 kU/L
Peanut IgE: <0.1 kU/L
Scallop IgE: <0.1 kU/L
Sesame Seed f10: <0.1 kU/L
Shrimp IgE: <0.1 kU/L
Soybean IgE: <0.1 kU/L
Tuna IgE: <0.1 kU/L
Walnut: <0.1 kU/L
Wheat IgE: <0.1 kU/L

## 2021-03-21 LAB — RESPIRATORY ALLERGY PROFILE REGION II ~~LOC~~
Allergen, A. alternata, m6: <0.1 kU/L
Allergen, Cedar tree, t12: <0.1 kU/L
Allergen, Comm Silver Birch, t9: <0.1 kU/L
Allergen, Cottonwood, t14: <0.1 kU/L
Allergen, D pternoyssinus,d7: <0.1 kU/L
Allergen, Mouse Urine Protein, e78: <0.1 kU/L
Allergen, Mulberry, t76: <0.1 kU/L
Allergen, Oak,t7: <0.1 kU/L
Allergen, P. notatum, m1: <0.1 kU/L
Aspergillus fumigatus, m3: <0.1 kU/L
Bermuda Grass: 0.13 kU/L — ABNORMAL HIGH
Box Elder IgE: <0.1 kU/L
CLADOSPORIUM HERBARUM (M2) IGE: <0.1 kU/L
COMMON RAGWEED (SHORT) (W1) IGE: <0.1 kU/L
Cat Dander: 0.14 kU/L — ABNORMAL HIGH
Class: 0
Class: 0
Class: 0
Class: 0
Class: 0
Class: 0
Class: 0
Class: 0
Class: 0
Class: 0
Class: 0
Class: 0
Class: 0
Class: 0
Class: 0
Class: 0
Class: 0
Class: 0
Class: 0
Class: 0
Class: 1
Cockroach: <0.1 kU/L
D. farinae: <0.1 kU/L
Dog Dander: 0.17 kU/L — ABNORMAL HIGH
Elm IgE: <0.1 kU/L
IgE (Immunoglobulin E), Serum: 25 kU/L (ref <=114)
Johnson Grass: <0.1 kU/L
Pecan/Hickory Tree IgE: <0.1 kU/L
Rough Pigweed  IgE: <0.1 kU/L
Sheep Sorrel IgE: <0.1 kU/L
Timothy Grass: 0.47 kU/L — ABNORMAL HIGH

## 2021-03-21 LAB — INTERPRETATION:

## 2021-05-07 ENCOUNTER — Encounter: Payer: Self-pay | Admitting: Internal Medicine

## 2021-05-14 DIAGNOSIS — M25562 Pain in left knee: Secondary | ICD-10-CM | POA: Diagnosis not present

## 2021-06-01 ENCOUNTER — Encounter: Payer: Self-pay | Admitting: Gastroenterology

## 2021-06-13 ENCOUNTER — Ambulatory Visit (AMBULATORY_SURGERY_CENTER): Payer: Federal, State, Local not specified - PPO | Admitting: *Deleted

## 2021-06-13 VITALS — Ht 65.0 in | Wt 179.0 lb

## 2021-06-13 DIAGNOSIS — Z1211 Encounter for screening for malignant neoplasm of colon: Secondary | ICD-10-CM

## 2021-06-13 MED ORDER — ONDANSETRON HCL 4 MG PO TABS
4.0000 mg | ORAL_TABLET | ORAL | 0 refills | Status: DC
Start: 2021-06-13 — End: 2021-07-18

## 2021-06-13 MED ORDER — PEG 3350-KCL-NA BICARB-NACL 420 G PO SOLR: 4000.0000 mL | Freq: Once | ORAL | 0 refills | Status: AC

## 2021-06-13 NOTE — Progress Notes (Signed)
No egg or soy allergy known to patient  ?No issues known to pt with past sedation with any surgeries or procedures ?Patient denies ever being told they had issues or difficulty with intubation  ?No FH of Malignant Hyperthermia ?Pt is not on diet pills ?Pt is not on  home 02  ?Pt is not on blood thinners  ?Pt denies issues with constipation  ?No A fib or A flutter ? ? ?PV completed over the phone. Pt verified name, DOB, address and insurance during PV today.  ?Pt mailed instruction packet with copy of consent form to read and not return, and instructions.  ?Pt encouraged to call with questions or issues.  ?If pt has My chart, procedure instructions sent via My Chart  ?Insurance confirmed with pt at PV today   ?

## 2021-07-18 ENCOUNTER — Encounter: Payer: Self-pay | Admitting: Gastroenterology

## 2021-07-18 ENCOUNTER — Ambulatory Visit (AMBULATORY_SURGERY_CENTER): Payer: Federal, State, Local not specified - PPO | Admitting: Gastroenterology

## 2021-07-18 VITALS — BP 126/61 | HR 66 | Temp 98.2°F | Resp 16 | Ht 65.0 in | Wt 179.0 lb

## 2021-07-18 DIAGNOSIS — Z1211 Encounter for screening for malignant neoplasm of colon: Secondary | ICD-10-CM | POA: Diagnosis not present

## 2021-07-18 MED ORDER — SODIUM CHLORIDE 0.9 % IV SOLN: 500.0000 mL | Freq: Once | INTRAVENOUS | Status: DC

## 2021-07-18 NOTE — Progress Notes (Signed)
Pt's states no medical or surgical changes since previsit or office visit. 

## 2021-07-18 NOTE — Progress Notes (Signed)
History and Physical:  This patient presents for endoscopic testing for: Encounter Diagnosis  Name Primary?   Special screening for malignant neoplasms, colon Yes    Average risk - last colonoscopy March 2013 Patient denies chronic abdominal pain, rectal bleeding, constipation or diarrhea.   Patient is otherwise without complaints or active issues today.   Past Medical History: Past Medical History:  Diagnosis Date   ALLERGIC RHINITIS 10/13/2006   ANEMIA-IRON DEFICIENCY 10/13/2006   resolved after had hysterectomy   ANXIETY 10/13/2006   ASTHMA 10/13/2006   Asthma    Bleeds easily (HCC)    Has had problems when has surgery and birthing   DEPRESSION 10/13/2006   resolved   Hernia of abdominal wall    HYPERLIPIDEMIA 10/13/2006   resolved   Migraines      Past Surgical History: Past Surgical History:  Procedure Laterality Date   ABDOMINAL HYSTERECTOMY     CESAREAN SECTION  1993   COLONOSCOPY     NECK SURGERY Left 06/05/2010   release pressure on nerve of left shoulder    Allergies: Allergies  Allergen Reactions   Aspirin Nausea Only    Outpatient Meds: Current Outpatient Medications  Medication Sig Dispense Refill   DRYSOL 20 % external solution APPLY TO AFFECTED AREA EVERY DAY AT BEDTIME 70 mL 1   acetaminophen (TYLENOL) 325 MG tablet Take 325-650 mg by mouth every 6 (six) hours as needed (for pain).      albuterol (VENTOLIN HFA) 108 (90 Base) MCG/ACT inhaler Inhale 2 puffs into the lungs every 6 (six) hours as needed for wheezing or shortness of breath. 8 g 11   aspirin-acetaminophen-caffeine (EXCEDRIN MIGRAINE) 250-250-65 MG tablet Take 1 tablet by mouth every 6 (six) hours as needed for headache or migraine.     EPINEPHrine 0.3 mg/0.3 mL IJ SOAJ injection Inject 0.3 mg into the muscle as needed for anaphylaxis. (Patient not taking: Reported on 06/13/2021) 1 each 3   fexofenadine (ALLEGRA) 180 MG tablet Take 1 tablet (180 mg total) by mouth daily. 30 tablet 11    lidocaine (LIDODERM) 5 % Place 1 patch onto the skin daily. Remove & Discard patch within 12 hours or as directed by MD (Patient not taking: Reported on 06/13/2021) 30 patch 0   Multiple Vitamins-Minerals (MULTIVITAMIN WOMEN 50+) TABS Take 1 tablet by mouth daily. (Patient not taking: Reported on 07/18/2021)     predniSONE (DELTASONE) 10 MG tablet 3 tabs by mouth per day for 5 days as needed (Patient not taking: Reported on 06/13/2021) 15 tablet 3   triamcinolone (NASACORT) 55 MCG/ACT AERO nasal inhaler Place 2 sprays into the nose daily. (Patient not taking: Reported on 07/18/2021) 1 each 12   Current Facility-Administered Medications  Medication Dose Route Frequency Provider Last Rate Last Admin   0.9 %  sodium chloride infusion  500 mL Intravenous Once Charlie Pitter III, MD          ___________________________________________________________________ Objective   Exam:  BP 103/69   Pulse 74   Temp 98.2 F (36.8 C) (Temporal)   Ht 5\' 5"  (1.651 m)   Wt 179 lb (81.2 kg)   SpO2 99%   BMI 29.79 kg/m   CV: RRR without murmur, S1/S2 Resp: clear to auscultation bilaterally, normal RR and effort noted GI: soft, no tenderness, with active bowel sounds.   Assessment: Encounter Diagnosis  Name Primary?   Special screening for malignant neoplasms, colon Yes     Plan: Colonoscopy  The benefits and risks of  the planned procedure were described in detail with the patient or (when appropriate) their health care proxy.  Risks were outlined as including, but not limited to, bleeding, infection, perforation, adverse medication reaction leading to cardiac or pulmonary decompensation, pancreatitis (if ERCP).  The limitation of incomplete mucosal visualization was also discussed.  No guarantees or warranties were given.    The patient is appropriate for an endoscopic procedure in the ambulatory setting.   - Wilfrid Lund, MD

## 2021-07-18 NOTE — Op Note (Signed)
[facility] Patient Name: Miranda Green Procedure Date: 07/18/2021 7:21 AM MRN: [id number] Endoscopist: Sherilyn Cooter L. Myrtie Neither , MD Age: 61 Referring MD:  Date of Birth: Jul 01, 1960 Gender: Female Account #: [id number] Procedure:                Colonoscopy Indications:              Screening for colorectal malignant neoplasm                           no polyps on last colonoscopy March 2013 Medicines:                Monitored Anesthesia Care Procedure:                Pre-Anesthesia Assessment:                           - Prior to the procedure, a History and Physical                            was performed, and patient medications and                            allergies were reviewed. The patient's tolerance of                            previous anesthesia was also reviewed. The risks                            and benefits of the procedure and the sedation                            options and risks were discussed with the patient.                            All questions were answered, and informed consent                            was obtained. Prior Anticoagulants: The patient has                            taken no previous anticoagulant or antiplatelet                            agents. ASA Grade Assessment: II - A patient with                            mild systemic disease. After reviewing the risks                            and benefits, the patient was deemed in                            satisfactory condition to undergo the procedure.  After obtaining informed consent, the colonoscope                            was passed under direct vision. Throughout the                            procedure, the patient's blood pressure, pulse, and                            oxygen saturations were monitored continuously. The                            Olympus CF-[id number]) Colonoscope was                            introduced through the anus  and advanced to the the                            cecum, identified by appendiceal orifice and                            ileocecal valve. The colonoscopy was performed                            without difficulty. The patient tolerated the                            procedure well. The quality of the bowel                            preparation was fair despite lavage (residual                            opaque liquid and fibrous debris (see photos). The                            ileocecal valve, appendiceal orifice, and rectum                            were photographed. The bowel preparation used was                            GoLYTELY. Scope In: 8:07:57 AM Scope Out: 8:25:13 AM Scope Withdrawal Time: 0 hours 12 minutes 32 seconds  Total Procedure Duration: 0 hours 17 minutes 16 seconds  Findings:                 The perianal and digital rectal examinations were                            normal.                           Repeat examination of right colon under NBI  performed.                           There is no endoscopic evidence of polyps in the                            entire colon.                           Retroflexion in the rectum was not performed due to                            anatomy.                           The entire examined colon appeared normal. Complications:            No immediate complications. Estimated Blood Loss:     Estimated blood loss: none. Impression:               - Preparation of the colon was fair.                           - The entire examined colon is normal.                           - No specimens collected. Recommendation:           - Patient has a contact number available for                            emergencies. The signs and symptoms of potential                            delayed complications were discussed with the                            patient. Return to normal activities tomorrow.                             Written discharge instructions were provided to the                            patient.                           - Resume previous diet.                           - Continue present medications.                           - Repeat colonoscopy in 3 years for screening                            purposes (fair prep, see above). For next exam,  miralax twice daily for two days before prep day                            and close attention to pre-procedure dietary                            restrictions re: seeds/fibrous foods. Daphnie Venturini L. Myrtie Neither, MD 07/18/2021 8:30:46 AM This report has been signed electronically.

## 2021-07-18 NOTE — Progress Notes (Signed)
Pt non-responsive, VVS, Report to RN  °

## 2021-07-18 NOTE — Patient Instructions (Signed)
Resume previous diet and continue present medications.  Repeat colonoscopy in 3 years for screening purposes - for next exam, Miralax twice daily for two days before prep day and close attention to pre-procedure dietary restrictions (re: seeds/fibrous foods)   YOU HAD AN ENDOSCOPIC PROCEDURE TODAY AT THE [facility]:   Refer to the procedure report that was given to you for any specific questions about what was found during the examination.  If the procedure report does not answer your questions, please call your gastroenterologist to clarify.  If you requested that your care partner not be given the details of your procedure findings, then the procedure report has been included in a sealed envelope for you to review at your convenience later.  YOU SHOULD EXPECT: Some feelings of bloating in the abdomen. Passage of more gas than usual.  Walking can help get rid of the air that was put into your GI tract during the procedure and reduce the bloating. If you had a lower endoscopy (such as a colonoscopy or flexible sigmoidoscopy) you may notice spotting of blood in your stool or on the toilet paper. If you underwent a bowel prep for your procedure, you may not have a normal bowel movement for a few days.  Please Note:  You might notice some irritation and congestion in your nose or some drainage.  This is from the oxygen used during your procedure.  There is no need for concern and it should clear up in a day or so.  SYMPTOMS TO REPORT IMMEDIATELY:  Following lower endoscopy (colonoscopy or flexible sigmoidoscopy):  Excessive amounts of blood in the stool  Significant tenderness or worsening of abdominal pains  Swelling of the abdomen that is new, acute  Fever of 100F or higher  For urgent or emergent issues, a gastroenterologist can be reached at any hour by calling [phone]. Do not use MyChart messaging for urgent concerns.    DIET:  We do recommend a small meal at first, but  then you may proceed to your regular diet.  Drink plenty of fluids but you should avoid alcoholic beverages for 24 hours.  ACTIVITY:  You should plan to take it easy for the rest of today and you should NOT DRIVE or use heavy machinery until tomorrow (because of the sedation medicines used during the test).    FOLLOW UP: Our staff will call the number listed on your records 48-72 hours following your procedure to check on you and address any questions or concerns that you may have regarding the information given to you following your procedure. If we do not reach you, we will leave a message.  We will attempt to reach you two times.  During this call, we will ask if you have developed any symptoms of COVID 19. If you develop any symptoms (ie: fever, flu-like symptoms, shortness of breath, cough etc.) before then, please call [phone].  If you test positive for Covid 19 in the 2 weeks post procedure, please call and report this information to [place].    If any biopsies were taken you will be contacted by phone or by letter within the next 1-3 weeks.  Please call us at [phone] if you have not heard about the biopsies in 3 weeks.    SIGNATURES/CONFIDENTIALITY: You and/or your care partner have signed paperwork which will be entered into your electronic medical record.  These signatures attest to the fact that that the information above on your After Visit Summary has  been reviewed and is understood.  Full responsibility of the confidentiality of this discharge information lies with you and/or your care-partner.  

## 2021-07-19 ENCOUNTER — Telehealth: Payer: Self-pay | Admitting: *Deleted

## 2021-07-19 ENCOUNTER — Telehealth: Payer: Self-pay | Admitting: Gastroenterology

## 2021-07-19 ENCOUNTER — Telehealth: Payer: Self-pay

## 2021-07-19 NOTE — Telephone Encounter (Signed)
Good morning,  Inbound call from patient . Patient states she had a procedure done 07/18/21. Patient states she is feeling fine just a little tender. Please give patient a call back to advise.  Thank You

## 2021-07-19 NOTE — Telephone Encounter (Signed)
Left message on follow up call. 

## 2021-07-19 NOTE — Telephone Encounter (Signed)
Thank you for the note.  No difficulty with the scope passage, uncomplicated procedure.  I expect this will pass soon.  HD

## 2021-07-19 NOTE — Telephone Encounter (Signed)
  Follow up Call-     07/18/2021    7:25 AM  Call back number  Post procedure Call Back phone  # [phone]  Permission to leave phone message Yes     Patient questions:  Do you have a fever, pain , or abdominal swelling? No. Pain Score  0 *  Have you tolerated food without any problems? Yes.    Have you been able to return to your normal activities? Yes.    Do you have any questions about your discharge instructions: Diet   No. Medications  No. Follow up visit  No.  Do you have questions or concerns about your Care? No.  Actions: * If pain score is 4 or above: No action needed, pain <4.

## 2021-07-19 NOTE — Telephone Encounter (Signed)
Returned pts call.  She states that she has a "soreness" in her abdomen.  She says that it is not really painful but that she just notices it and wanted to make sure it wasn't a problem.  Otherwise she has no other complaints.  I explained to her that we sometimes have to apply pressure to the abdomen to advance the scope during the colonoscopy.  Advised her that these symptoms should improve in a day or so but to call us back if the soreness worsens or does not improve.

## 2021-11-22 ENCOUNTER — Telehealth (INDEPENDENT_AMBULATORY_CARE_PROVIDER_SITE_OTHER): Payer: Federal, State, Local not specified - PPO | Admitting: Family Medicine

## 2021-11-22 VITALS — Temp 99.9°F

## 2021-11-22 DIAGNOSIS — U071 COVID-19: Secondary | ICD-10-CM | POA: Diagnosis not present

## 2021-11-22 MED ORDER — NIRMATRELVIR/RITONAVIR (PAXLOVID)TABLET
3.0000 | ORAL_TABLET | Freq: Two times a day (BID) | ORAL | 0 refills | Status: AC
Start: 2021-11-22 — End: 2021-11-27

## 2021-11-22 MED ORDER — FLUTICASONE PROPIONATE 50 MCG/ACT NA SUSP: 2.0000 | Freq: Every day | NASAL | 6 refills | Status: AC

## 2021-11-22 MED ORDER — BENZONATATE 200 MG PO CAPS: 200.0000 mg | ORAL_CAPSULE | Freq: Two times a day (BID) | ORAL | 0 refills | Status: DC | PRN

## 2021-11-22 NOTE — Patient Instructions (Signed)
For COVID, Quarantine at home for 5 days since the start of illness. After this you, may exit quarantine, but please still wear a mask indoors and outdoors for 5 more days.   Please be sure to drink plenty of fluids.   You may take the following OTC medications to help with symptoms:  For cough, use  cough syrups or other cough suppressants.  For headache, sore throat, fevers, muscle aches, chills, other pain, take ibuprofen or tylenol  For congestion, use nasal sprays, decongestants, or antihistamines  Please follow up if no improvement.   Go to ED if you have severe chest pain, fevers, shortness of breath or other worrisome symptoms.   

## 2021-11-22 NOTE — Progress Notes (Signed)
Virtual Visit via Video Note  I connected with Miranda Green on 11/25/21 at 10:40 AM EDT by a video enabled telemedicine application and verified that I am speaking with the correct person using two identifiers.  Location: Patient: Home Provider: Office   I discussed the limitations of evaluation and management by telemedicine and the availability of in person appointments. The patient expressed understanding and agreed to proceed.  History of Present Illness:    COVID19. She complains of achiness, congestion, headache described as pressure, and sore throat.with fever up to 102F. Onset of symptoms was a few days ago and staying constant.She is drinking plenty of fluids.  Patient tested positive for COVID yesterday. Treatment to date: cough suppressants and OTC tylenol. It has helped some.  Past history is significant for asthma. Patient is non-smoker. COVID Vaccination status: Moderna Original series: Yes, Booster: Yes 2021.   ROS: The patient denies chest pain, dyspnea, wheezing or hemoptysis.   Observations/Objective: Gen: NAD, resting comfortably HEENT: EOMI Pulm: NWOB Skin: no rash on face Neuro: no facial asymmetry or dysmetria Psych: Normal affect   Assessment and Plan: COVID-19 High risk based on age Quarantine ED precaution discussed Paxlovid   Follow Up Instructions:    I discussed the assessment and treatment plan with the patient. The patient was provided an opportunity to ask questions and all were answered. The patient agreed with the plan and demonstrated an understanding of the instructions.   The patient was advised to call back or seek an in-person evaluation if the symptoms worsen or if the condition fails to improve as anticipated.  I provided 40 minutes minutes of non-face-to-face time during this encounter.   Bonnita Hollow, MD

## 2021-11-27 ENCOUNTER — Other Ambulatory Visit: Payer: Self-pay | Admitting: Internal Medicine

## 2021-11-27 DIAGNOSIS — Z1231 Encounter for screening mammogram for malignant neoplasm of breast: Secondary | ICD-10-CM

## 2021-12-26 ENCOUNTER — Ambulatory Visit
Admission: RE | Admit: 2021-12-26 | Discharge: 2021-12-26 | Disposition: A | Discharge status: Home / Self Care | Payer: Federal, State, Local not specified - PPO | Source: Ambulatory Visit | Attending: Internal Medicine | Admitting: Internal Medicine

## 2021-12-26 DIAGNOSIS — Z1231 Encounter for screening mammogram for malignant neoplasm of breast: Secondary | ICD-10-CM | POA: Diagnosis not present

## 2022-05-06 ENCOUNTER — Ambulatory Visit: Payer: Federal, State, Local not specified - PPO | Admitting: Internal Medicine

## 2022-05-08 ENCOUNTER — Ambulatory Visit: Payer: Federal, State, Local not specified - PPO | Admitting: Internal Medicine

## 2022-05-08 ENCOUNTER — Other Ambulatory Visit: Payer: Self-pay | Admitting: Internal Medicine

## 2022-05-08 VITALS — BP 122/70 | HR 73 | Temp 98.6°F | Ht 65.0 in | Wt 180.0 lb

## 2022-05-08 DIAGNOSIS — H16429 Pannus (corneal), unspecified eye: Secondary | ICD-10-CM

## 2022-05-08 DIAGNOSIS — E538 Deficiency of other specified B group vitamins: Secondary | ICD-10-CM

## 2022-05-08 DIAGNOSIS — R21 Rash and other nonspecific skin eruption: Secondary | ICD-10-CM

## 2022-05-08 DIAGNOSIS — E559 Vitamin D deficiency, unspecified: Secondary | ICD-10-CM | POA: Diagnosis not present

## 2022-05-08 DIAGNOSIS — F5101 Primary insomnia: Secondary | ICD-10-CM | POA: Diagnosis not present

## 2022-05-08 DIAGNOSIS — G5602 Carpal tunnel syndrome, left upper limb: Secondary | ICD-10-CM | POA: Diagnosis not present

## 2022-05-08 DIAGNOSIS — E65 Localized adiposity: Secondary | ICD-10-CM

## 2022-05-08 DIAGNOSIS — E7800 Pure hypercholesterolemia, unspecified: Secondary | ICD-10-CM

## 2022-05-08 DIAGNOSIS — G4700 Insomnia, unspecified: Secondary | ICD-10-CM | POA: Insufficient documentation

## 2022-05-08 DIAGNOSIS — R739 Hyperglycemia, unspecified: Secondary | ICD-10-CM

## 2022-05-08 DIAGNOSIS — Z0001 Encounter for general adult medical examination with abnormal findings: Secondary | ICD-10-CM | POA: Diagnosis not present

## 2022-05-08 LAB — BASIC METABOLIC PANEL
BUN: 13 mg/dL (ref 6–23)
CO2: 27 mEq/L (ref 19–32)
Calcium: 9.8 mg/dL (ref 8.4–10.5)
Chloride: 104 mEq/L (ref 96–112)
Creatinine, Ser: 0.82 mg/dL (ref 0.40–1.20)
GFR: 76.72 mL/min (ref >60.00)
Glucose, Bld: 86 mg/dL (ref 70–99)
Potassium: 3.9 mEq/L (ref 3.5–5.1)
Sodium: 140 mEq/L (ref 135–145)

## 2022-05-08 LAB — LIPID PANEL
Cholesterol: 217 mg/dL — ABNORMAL HIGH (ref 0–200)
HDL: 44 mg/dL (ref >39.00)
LDL Cholesterol: 162 mg/dL — ABNORMAL HIGH (ref 0–99)
NonHDL: 172.86
Total CHOL/HDL Ratio: 5
Triglycerides: 56 mg/dL (ref 0.0–149.0)
VLDL: 11.2 mg/dL (ref 0.0–40.0)

## 2022-05-08 LAB — CBC WITH DIFFERENTIAL/PLATELET
Basophils Absolute: 0 10*3/uL (ref 0.0–0.1)
Basophils Relative: 0.9 % (ref 0.0–3.0)
Eosinophils Absolute: 0.1 10*3/uL (ref 0.0–0.7)
Eosinophils Relative: 2.8 % (ref 0.0–5.0)
HCT: 40.2 % (ref 36.0–46.0)
Hemoglobin: 13.3 g/dL (ref 12.0–15.0)
Lymphocytes Relative: 32.3 % (ref 12.0–46.0)
Lymphs Abs: 1.5 10*3/uL (ref 0.7–4.0)
MCHC: 33.2 g/dL (ref 30.0–36.0)
MCV: 85.9 fl (ref 78.0–100.0)
Monocytes Absolute: 0.4 10*3/uL (ref 0.1–1.0)
Monocytes Relative: 8.7 % (ref 3.0–12.0)
Neutro Abs: 2.6 10*3/uL (ref 1.4–7.7)
Neutrophils Relative %: 55.3 % (ref 43.0–77.0)
Platelets: 250 10*3/uL (ref 150.0–400.0)
RBC: 4.68 Mil/uL (ref 3.87–5.11)
RDW: 13.6 % (ref 11.5–15.5)
WBC: 4.7 10*3/uL (ref 4.0–10.5)

## 2022-05-08 LAB — HEPATIC FUNCTION PANEL
ALT: 14 U/L (ref 0–35)
AST: 18 U/L (ref 0–37)
Albumin: 4.4 g/dL (ref 3.5–5.2)
Alkaline Phosphatase: 38 U/L — ABNORMAL LOW (ref 39–117)
Bilirubin, Direct: 0.1 mg/dL (ref 0.0–0.3)
Total Bilirubin: 0.9 mg/dL (ref 0.2–1.2)
Total Protein: 7.6 g/dL (ref 6.0–8.3)

## 2022-05-08 LAB — MICROALBUMIN / CREATININE URINE RATIO
Creatinine,U: 196 mg/dL
Microalb Creat Ratio: 0.4 mg/g (ref 0.0–30.0)
Microalb, Ur: 0.7 mg/dL (ref 0.0–1.9)

## 2022-05-08 LAB — TSH: TSH: 1.15 u[IU]/mL (ref 0.35–5.50)

## 2022-05-08 LAB — VITAMIN B12: Vitamin B-12: 662 pg/mL (ref 211–911)

## 2022-05-08 LAB — HEMOGLOBIN A1C: Hgb A1c MFr Bld: 5.6 % (ref 4.6–6.5)

## 2022-05-08 LAB — VITAMIN D 25 HYDROXY (VIT D DEFICIENCY, FRACTURES): VITD: 17.56 ng/mL — ABNORMAL LOW (ref 30.00–100.00)

## 2022-05-08 MED ORDER — NYSTATIN 100000 UNIT/GM EX POWD: CUTANEOUS | 1 refills | Status: AC

## 2022-05-08 NOTE — Assessment & Plan Note (Signed)
Groin, for nystatin powder asd prn

## 2022-05-08 NOTE — Patient Instructions (Addendum)
Please let us know if you have the name of the medication for sleep you mentioned  Please take all new medication as prescribed - the nystatin powder for the itchy rash  Please continue all other medications as before, and refills have been done if requested.  Please have the pharmacy call with any other refills you may need.  Please continue your efforts at being more active, low cholesterol diet, and weight control.  You are otherwise up to date with prevention measures today.  Please keep your appointments with your specialists as you may have planned  You will be contacted regarding the referral for: Hand Surgury, and Plastic Surgury  Please go to the LAB at the blood drawing area for the tests to be done  You will be contacted by phone if any changes need to be made immediately.  Otherwise, you will receive a letter about your results with an explanation, but please check with MyChart first.  Please remember to sign up for MyChart if you have not done so, as this will be important to you in the future with finding out test results, communicating by private email, and scheduling acute appointments online when needed.  Please make an Appointment to return for your 1 year visit, or sooner if needed

## 2022-05-08 NOTE — Progress Notes (Signed)
Patient ID: Miranda Green, female   DOB: 1960-07-18, 62 y.o.   MRN: [id number]         Chief Complaint:: wellness exam and referral for ortho (Pinky finger-numbness and poor circulation)  As well as abdominal pannus with rash, and insomnia       HPI:  Miranda Green is a 62 y.o. female here for wellness exam; declines covid booster, o/w up to date                        Also Pt denies chest pain, increased sob or doe, wheezing, orthopnea, PND, increased LE swelling, palpitations, dizziness or syncope.   Pt denies polydipsia, polyuria, or new focal neuro s/s, except for left hand neuritic type numbness to the lateral hand and fingers.  Also has a left post hand tender swelling area.  Also has a rash below the pannus to the lower abd resulting after wt loss..  Pt denies recent night sweats, loss of appetite, or other constitutional symptoms   Doing water aerobics 3 times per wk.  Also with 3 mo worsening rash itchy red and scaly at times below the pannus.   Wt Readings from Last 3 Encounters:  05/08/22 180 lb (81.6 kg)  07/18/21 179 lb (81.2 kg)  06/13/21 179 lb (81.2 kg)   BP Readings from Last 3 Encounters:  05/08/22 122/70  07/18/21 126/61  03/20/21 118/68   Immunization History  Administered Date(s) Administered   Influenza Inj Mdck Quad Pf 12/10/2016, 02/22/2022   Influenza Split 12/02/2012   Influenza,inj,Quad PF,6+ Mos 03/05/2018, 12/02/2018   Influenza-Unspecified 12/10/2016   Moderna SARS-COV2 Booster Vaccination 11/22/2019   Moderna Sars-Covid-2 Vaccination 04/21/2019, 05/19/2019   Tdap 07/01/2018   Zoster Recombinat (Shingrix) 06/19/2020, 03/20/2021  There are no preventive care reminders to display for this patient.    Past Medical History:  Diagnosis Date   ALLERGIC RHINITIS 10/13/2006   ANEMIA-IRON DEFICIENCY 10/13/2006   resolved after had hysterectomy   ANXIETY 10/13/2006   ASTHMA 10/13/2006   Asthma    Bleeds easily ([place])    Has had problems when has  surgery and birthing   DEPRESSION 10/13/2006   resolved   Hernia of abdominal wall    HYPERLIPIDEMIA 10/13/2006   resolved   Migraines    Past Surgical History:  Procedure Laterality Date   ABDOMINAL HYSTERECTOMY     CESAREAN SECTION  1993   COLONOSCOPY     NECK SURGERY Left 06/05/2010   release pressure on nerve of left shoulder    reports that she has never smoked. She has never used smokeless tobacco. She reports current alcohol use of about 2.0 standard drinks of alcohol per week. She reports that she does not use drugs. family history includes Anuerysm in her father; Breast cancer in her sister and another family member; Colon polyps in her sister; Diabetes in her brother, father, mother, and sister; Heart disease in her mother; Hypertension in an other family member. Allergies  Allergen Reactions   Aspirin Nausea Only   Current Outpatient Medications on File Prior to Visit  Medication Sig Dispense Refill   acetaminophen (TYLENOL) 325 MG tablet Take 325-650 mg by mouth every 6 (six) hours as needed (for pain).      albuterol (VENTOLIN HFA) 108 (90 Base) MCG/ACT inhaler Inhale 2 puffs into the lungs every 6 (six) hours as needed for wheezing or shortness of breath. 8 g 11   aspirin-acetaminophen-caffeine (EXCEDRIN MIGRAINE) [id number] MG tablet  Take 1 tablet by mouth every 6 (six) hours as needed for headache or migraine.     benzonatate (TESSALON) 200 MG capsule Take 1 capsule (200 mg total) by mouth 2 (two) times daily as needed for cough. 20 capsule 0   EPINEPHrine 0.3 mg/0.3 mL IJ SOAJ injection Inject 0.3 mg into the muscle as needed for anaphylaxis. 1 each 3   fluticasone (FLONASE) 50 MCG/ACT nasal spray Place 2 sprays into both nostrils daily. 16 g 6   Multiple Vitamins-Minerals (MULTIVITAMIN WOMEN 50+) TABS Take 1 tablet by mouth daily.     triamcinolone (NASACORT) 55 MCG/ACT AERO nasal inhaler Place 2 sprays into the nose daily. 1 each 12   DRYSOL 20 % external solution  APPLY TO AFFECTED AREA EVERY DAY AT BEDTIME (Patient not taking: Reported on 11/22/2021) 70 mL 1   fexofenadine (ALLEGRA) 180 MG tablet Take 1 tablet (180 mg total) by mouth daily. 30 tablet 11   lidocaine (LIDODERM) 5 % Place 1 patch onto the skin daily. Remove & Discard patch within 12 hours or as directed by MD (Patient not taking: Reported on 06/13/2021) 30 patch 0   predniSONE (DELTASONE) 10 MG tablet 3 tabs by mouth per day for 5 days as needed (Patient not taking: Reported on 06/13/2021) 15 tablet 3   No current facility-administered medications on file prior to visit.        ROS:  All others reviewed and negative.  Objective        PE:  BP 122/70   Pulse 73   Temp 98.6 F (37 C) (Oral)   Ht 5\' 5"  (1.651 m)   Wt 180 lb (81.6 kg)   SpO2 97%   BMI 29.95 kg/m                 Constitutional: Pt appears in NAD               HENT: Head: NCAT.                Right Ear: External ear normal.                 Left Ear: External ear normal.                Eyes: . Pupils are equal, round, and reactive to light. Conjunctivae and EOM are normal               Nose: without d/c or deformity               Neck: Neck supple. Gross normal ROM               Cardiovascular: Normal rate and regular rhythm.                 Pulmonary/Chest: Effort normal and breath sounds without rales or wheezing.                Abd:  Soft, NT, ND, + BS, no organomegaly               Neurological: Pt is alert. At baseline orientation, motor grossly intact               Skin: Skin is warm. No rashes, no other new lesions, LE edema - none               Psychiatric: Pt behavior is normal without agitation   Micro: none  Cardiac tracings I have personally interpreted today:  none  Pertinent Radiological findings (summarize): none   Lab Results  Component Value Date   WBC 4.7 05/08/2022   HGB 13.3 05/08/2022   HCT 40.2 05/08/2022   PLT 250.0 05/08/2022   GLUCOSE 86 05/08/2022   CHOL 217 (H) 05/08/2022   TRIG  56.0 05/08/2022   HDL 44.00 05/08/2022   LDLCALC 162 (H) 05/08/2022   ALT 14 05/08/2022   AST 18 05/08/2022   NA 140 05/08/2022   K 3.9 05/08/2022   CL 104 05/08/2022   CREATININE 0.82 05/08/2022   BUN 13 05/08/2022   CO2 27 05/08/2022   TSH 1.15 05/08/2022   INR 0.94 05/04/2017   HGBA1C 5.6 05/08/2022   MICROALBUR 0.7 05/08/2022   Assessment/Plan:  SIMRANJIT CHERRINGTON is a 62 y.o. Black or African American [2] female with  has a past medical history of ALLERGIC RHINITIS (10/13/2006), ANEMIA-IRON DEFICIENCY (10/13/2006), ANXIETY (10/13/2006), ASTHMA (10/13/2006), Asthma, Bleeds easily ([place]), DEPRESSION (10/13/2006), Hernia of abdominal wall, HYPERLIPIDEMIA (10/13/2006), and Migraines.  Rash Groin, for nystatin powder asd prn  Encounter for well adult exam with abnormal findings Age and sex appropriate education and counseling updated with regular exercise and diet Referrals for preventative services - none needed Immunizations addressed - declines covid booster Smoking counseling  - none needed Evidence for depression or other mood disorder - none significant Most recent labs reviewed. I have personally reviewed and have noted: 1) the patient's medical and social history 2) The patient's current medications and supplements 3) The patient's height, weight, and BMI have been recorded in the chart   Hyperglycemia Lab Results  Component Value Date   HGBA1C 5.6 05/08/2022   Stable, pt to continue current medical treatment  - diet, wt control   Hyperlipidemia Lab Results  Component Value Date   LDLCALC 162 (H) 05/08/2022   Uncontrolled,  pt to start crestor 10 mg qd   Vitamin D deficiency Last vitamin D Lab Results  Component Value Date   VD25OH 17.56 (L) 05/08/2022   Low, to start oral replacement   Insomnia Pt encouraged for melatonin asd qhs prn  Abdominal pannus Ok for plastic surgury referral  Carpal tunnel syndrome on left Mild recently worsening, for  hand surgury referral also for ganglion cyst management  Followup: Return in about 1 year (around 05/08/2023).  Cathlean Cower, MD 05/11/2022 6:36 PM [facility]

## 2022-05-09 ENCOUNTER — Other Ambulatory Visit: Payer: Self-pay | Admitting: Internal Medicine

## 2022-05-09 LAB — URINALYSIS, ROUTINE W REFLEX MICROSCOPIC
Hgb urine dipstick: NEGATIVE
Leukocytes,Ua: NEGATIVE
Nitrite: NEGATIVE
Specific Gravity, Urine: >=1.03 — AB (ref 1.000–1.030)
Total Protein, Urine: NEGATIVE
Urine Glucose: NEGATIVE
Urobilinogen, UA: 0.2 (ref 0.0–1.0)
pH: 6 (ref 5.0–8.0)

## 2022-05-09 MED ORDER — ROSUVASTATIN CALCIUM 10 MG PO TABS: 10.0000 mg | ORAL_TABLET | Freq: Every day | ORAL | 3 refills | Status: DC

## 2022-05-11 ENCOUNTER — Encounter: Payer: Self-pay | Admitting: Internal Medicine

## 2022-05-11 DIAGNOSIS — E65 Localized adiposity: Secondary | ICD-10-CM | POA: Insufficient documentation

## 2022-05-11 DIAGNOSIS — G5602 Carpal tunnel syndrome, left upper limb: Secondary | ICD-10-CM | POA: Insufficient documentation

## 2022-05-11 NOTE — Assessment & Plan Note (Signed)
Pt encouraged for melatonin asd qhs prn

## 2022-05-11 NOTE — Assessment & Plan Note (Signed)

## 2022-05-11 NOTE — Assessment & Plan Note (Signed)
Mild recently worsening, for hand surgury referral also for ganglion cyst management

## 2022-05-11 NOTE — Assessment & Plan Note (Signed)
Lab Results  Component Value Date   HGBA1C 5.6 05/08/2022   Stable, pt to continue current medical treatment  - diet, wt control

## 2022-05-11 NOTE — Assessment & Plan Note (Signed)
Last vitamin D Lab Results  Component Value Date   VD25OH 17.56 (L) 05/08/2022   Low, to start oral replacement

## 2022-05-11 NOTE — Assessment & Plan Note (Signed)
[place] for plastic surgury referral

## 2022-05-11 NOTE — Assessment & Plan Note (Signed)
Lab Results  Component Value Date   LDLCALC 162 (H) 05/08/2022   Uncontrolled,  pt to start crestor 10 mg qd

## 2022-05-16 ENCOUNTER — Telehealth: Payer: Self-pay | Admitting: Internal Medicine

## 2022-05-16 NOTE — Telephone Encounter (Signed)
Patient received the Crestor when she went to the pharmacy - patient does not want to take this and states that she told Dr. Mitchel Honour this.  She needs a Vitamin D. Supplement called in to her pharmacy.  Patient states that her cardiologist did not want her on the statins.  Patient number:  [phone]

## 2022-05-16 NOTE — Telephone Encounter (Signed)
Please advise 

## 2022-05-16 NOTE — Telephone Encounter (Signed)
Vit d rx not needed - ok to Please take OTC Vitamin D3 at 2000 units per day, indefinitely, or 4000 units if you already take the 2000 units.

## 2022-05-22 ENCOUNTER — Ambulatory Visit: Payer: Federal, State, Local not specified - PPO | Admitting: Physician Assistant

## 2022-05-22 ENCOUNTER — Other Ambulatory Visit (INDEPENDENT_AMBULATORY_CARE_PROVIDER_SITE_OTHER): Payer: Federal, State, Local not specified - PPO

## 2022-05-22 ENCOUNTER — Encounter: Payer: Self-pay | Admitting: Physician Assistant

## 2022-05-22 DIAGNOSIS — M542 Cervicalgia: Secondary | ICD-10-CM

## 2022-05-22 DIAGNOSIS — M5010 Cervical disc disorder with radiculopathy, unspecified cervical region: Secondary | ICD-10-CM

## 2022-05-22 MED ORDER — METHYLPREDNISOLONE 4 MG PO TBPK: ORAL_TABLET | ORAL | 0 refills | Status: DC

## 2022-05-22 NOTE — Telephone Encounter (Signed)
Patient informed that vit D3 is OTC

## 2022-05-22 NOTE — Progress Notes (Signed)
Office Visit Note   Patient: Miranda Green           Date of Birth: 06-Sep-1960           MRN: [id number] Visit Date: 05/22/2022              Requested by: Biagio Borg, MD [address] [place],  [place] [address] PCP: Biagio Borg, MD   Assessment & Plan: Visit Diagnoses:  1. Neck pain     Plan: Pleasant 62 year old woman with a history of cervical discectomy in 2012 presents today with paresthesias in her fifth digit of her left hand as well as some anterior shoulder pain after increasing her weights at an exercise class.  Denies any bruising.  Exam indicates some biceps proximal irritation.  Given her history of neck discectomy 12 years ago and she does have some radicular findings could also be an exacerbation of the symptoms.  We talked about treatments.  She has taken a Medrol Dosepak in the past.  Discussed trying this and she is willing to do this.  She is trying to stay healthy and exercise she understands take this medication with food and not to take other anti-inflammatories with it.  She will follow-up in a month  Follow-Up Instructions: As needed if steroid not helpful  Orders:  Orders Placed This Encounter  Procedures   XR Cervical Spine 2 or 3 views   No orders of the defined types were placed in this encounter.     Procedures: No procedures performed   Clinical Data: No additional findings.   Subjective: Chief Complaint  Patient presents with   Left Hand - Numbness    HPI Miranda Green is a pleasant 62 year old woman who comes in with a 2-week history of paresthesia in her left fifth digit.  She is right-handed.  She says sometimes she gets pain that shoots down her arm from her neck.  She does not have any paresthesias in the thumb index or long finger.  She thinks this began with some exercises she was doing with water aerobics and had increased her weights.  She does have a history of an herniated disc at C6-7 in 2012, and underwent a cervical C6-7  discectomy with Dr. Ellene Route  Review of Systems  All other systems reviewed and are negative.    Objective: Vital Signs: There were no vitals taken for this visit.  Physical Exam Constitutional:      Appearance: Normal appearance.  Pulmonary:     Effort: Pulmonary effort is normal.  Skin:    General: Skin is warm and dry.  Neurological:     General: No focal deficit present.     Mental Status: She is alert.     Ortho Exam exam nation of her neck she has good forward flexion extension side-to-side turning.  No tenderness in her neck itself.  She does have some anterior shoulder tenderness.  Good range of motion strength is 5 out of 5 no impingement findings speeds test is negative..  Has some altered sensation on the volar surface at the distal phalanx of the fifth finger in the left hand.  Grip strength is good no pain with pronation or supination negative Tinel's sign in the elbow no radiation of symptoms from elbow to the hand.  Normal sensation otherwise.  Strength intact Specialty Comments:  No specialty comments available.  Imaging: No results found.   PMFS History: Patient Active Problem List   Diagnosis Date Noted  Carpal tunnel syndrome on left 05/11/2022   Abdominal pannus 05/11/2022   Insomnia 05/08/2022   Angioedema 03/20/2021   Sacroiliac joint stiffness 06/26/2020   Vitamin D deficiency 07/10/2019   Hyperglycemia 07/10/2019   Syncope 05/27/2019   Concussion with no loss of consciousness 04/30/2017   Neck pain 04/30/2017   Hot flashes 08/04/2016   Abnormal auditory perception of right ear 07/29/2016   Impacted cerumen of both ears 07/29/2016   Pulsatile tinnitus 07/29/2016   Acute hearing loss, right [id number]   Folliculitis [id number]   Chest pain 10/07/2014   Migraine 06/26/2013   Rash 08/18/2011   Encounter for well adult exam with abnormal findings 02/05/2011   CERVICAL RADICULOPATHY, LEFT 04/05/2010   BREAST CYST, RIGHT 03/19/2010    PALPITATIONS, RECURRENT 03/16/2010   FREQUENCY, URINARY 03/16/2010   SHOULDER PAIN, LEFT 11/10/2009   ELBOW PAIN, RIGHT 11/10/2009   SUPERFICIAL THROMBOPHLEBITIS 05/04/2007   Hyperlipidemia 10/13/2006   ANEMIA-IRON DEFICIENCY 10/13/2006   ANXIETY 10/13/2006   Depression with anxiety 10/13/2006   ALLERGIC RHINITIS 10/13/2006   Asthma 10/13/2006   COLONIC POLYPS, HX OF 10/13/2006   OBESITY 10/07/2006   Past Medical History:  Diagnosis Date   ALLERGIC RHINITIS 10/13/2006   ANEMIA-IRON DEFICIENCY 10/13/2006   resolved after had hysterectomy   ANXIETY 10/13/2006   ASTHMA 10/13/2006   Asthma    Bleeds easily    Has had problems when has surgery and birthing   DEPRESSION 10/13/2006   resolved   Hernia of abdominal wall    HYPERLIPIDEMIA 10/13/2006   resolved   Migraines     Family History  Problem Relation Age of Onset   Hypertension Other        entire family   Colon polyps Sister    Breast cancer Sister    Diabetes Sister        x 6   Heart disease Mother    Diabetes Mother    Anuerysm Father        brain   Diabetes Father    Breast cancer Other    Diabetes Brother        x 2   Colon cancer Neg Hx    Esophageal cancer Neg Hx    Stomach cancer Neg Hx    Rectal cancer Neg Hx     Past Surgical History:  Procedure Laterality Date   ABDOMINAL HYSTERECTOMY     CESAREAN SECTION  1993   COLONOSCOPY     NECK SURGERY Left 06/05/2010   release pressure on nerve of left shoulder   Social History   Occupational History   Occupation: bankrupcy specialist  Tobacco Use   Smoking status: Never   Smokeless tobacco: Never  Substance and Sexual Activity   Alcohol use: Yes    Alcohol/week: 2.0 standard drinks of alcohol    Types: 2 Glasses of wine per week    Comment: occasionally   Drug use: No   Sexual activity: Not on file

## 2022-06-05 ENCOUNTER — Ambulatory Visit: Payer: Federal, State, Local not specified - PPO | Admitting: Physician Assistant

## 2022-06-27 ENCOUNTER — Institutional Professional Consult (permissible substitution): Payer: Federal, State, Local not specified - PPO | Admitting: Plastic Surgery

## 2022-08-01 ENCOUNTER — Institutional Professional Consult (permissible substitution): Payer: Federal, State, Local not specified - PPO | Admitting: Plastic Surgery

## 2022-09-30 DIAGNOSIS — M7711 Lateral epicondylitis, right elbow: Secondary | ICD-10-CM | POA: Diagnosis not present

## 2022-10-12 ENCOUNTER — Other Ambulatory Visit: Payer: Self-pay

## 2022-10-12 ENCOUNTER — Emergency Department (HOSPITAL_BASED_OUTPATIENT_CLINIC_OR_DEPARTMENT_OTHER)
Admission: EM | Admit: 2022-10-12 | Discharge: 2022-10-12 | Disposition: A | Discharge status: Home / Self Care | Payer: Federal, State, Local not specified - PPO | Source: Home / Self Care | Attending: Emergency Medicine | Admitting: Emergency Medicine

## 2022-10-12 DIAGNOSIS — Z7982 Long term (current) use of aspirin: Secondary | ICD-10-CM | POA: Diagnosis not present

## 2022-10-12 DIAGNOSIS — G43809 Other migraine, not intractable, without status migrainosus: Secondary | ICD-10-CM | POA: Diagnosis not present

## 2022-10-12 DIAGNOSIS — R519 Headache, unspecified: Secondary | ICD-10-CM | POA: Diagnosis not present

## 2022-10-12 MED ORDER — METOCLOPRAMIDE HCL 5 MG/ML IJ SOLN
10.0000 mg | Freq: Once | INTRAMUSCULAR | Status: AC
  Administered 2022-10-12: 10 mg via INTRAMUSCULAR
  Filled 2022-10-12: qty 2

## 2022-10-12 MED ORDER — DIPHENHYDRAMINE HCL 50 MG/ML IJ SOLN
12.5000 mg | Freq: Once | INTRAMUSCULAR | Status: AC
  Administered 2022-10-12: 12.5 mg via INTRAMUSCULAR
  Filled 2022-10-12: qty 1

## 2022-10-12 NOTE — ED Triage Notes (Signed)
Pt arrived POV, caox4, ambulatory c/o migraine that has been worsening since waking up this morning. Pt also c/o nausea. Hx migraines. Last took Tylenol at 1130 with no relief. Pt denies any recent trauma to the head.

## 2022-10-12 NOTE — ED Provider Notes (Signed)
[place] EMERGENCY DEPARTMENT AT [facility] Provider Note   CSN: [id number] Arrival date & time: 10/12/22  1436     History  Chief Complaint  Patient presents with   Migraine    Miranda Green is a 62 y.o. female.  62 year old female with history of migraines presents with left-sided headache.  Similar to her prior migraine equivalent.  States that they can be precipitated by stress and states she has been under more stress recently.  Denies any fever or chills.  No nausea or vomiting.  Slight photophobia.  No visual changes.  No new peripheral weakness.  Unresponsive to over-the-counter medications      Home Medications Prior to Admission medications   Medication Sig Start Date End Date Taking? Authorizing Provider  acetaminophen (TYLENOL) 325 MG tablet Take 325-650 mg by mouth every 6 (six) hours as needed (for pain).     [provider]  albuterol (VENTOLIN HFA) 108 (90 Base) MCG/ACT inhaler Inhale 2 puffs into the lungs every 6 (six) hours as needed for wheezing or shortness of breath. 03/20/21   Corwin Levins, MD  aspirin-acetaminophen-caffeine (EXCEDRIN MIGRAINE) [phone] MG tablet Take 1 tablet by mouth every 6 (six) hours as needed for headache or migraine.    [provider]  benzonatate (TESSALON) 200 MG capsule Take 1 capsule (200 mg total) by mouth 2 (two) times daily as needed for cough. 11/22/21   Garnette Gunner, MD  DRYSOL 20 % external solution APPLY TO AFFECTED AREA EVERY DAY AT BEDTIME 07/01/17   Corwin Levins, MD  EPINEPHrine 0.3 mg/0.3 mL IJ SOAJ injection Inject 0.3 mg into the muscle as needed for anaphylaxis. 03/20/21   Corwin Levins, MD  fexofenadine (ALLEGRA) 180 MG tablet Take 1 tablet (180 mg total) by mouth daily. 03/20/21 03/20/22  Corwin Levins, MD  fluticasone (FLONASE) 50 MCG/ACT nasal spray Place 2 sprays into both nostrils daily. 11/22/21   Garnette Gunner, MD  lidocaine (LIDODERM) 5 % Place 1 patch onto the skin daily.  Remove & Discard patch within 12 hours or as directed by MD 07/17/16   Joy, Shawn C, PA-C  methylPREDNISolone (MEDROL DOSEPAK) 4 MG TBPK tablet Take as directed on package. 05/22/22   Persons, [place], PA  Multiple Vitamins-Minerals (MULTIVITAMIN WOMEN 50+) TABS Take 1 tablet by mouth daily.    [provider]  nystatin (MYCOSTATIN/NYSTOP) powder Use as directed twice per day as needed 05/08/22   Corwin Levins, MD  predniSONE (DELTASONE) 10 MG tablet 3 tabs by mouth per day for 5 days as needed 03/20/21   Corwin Levins, MD  rosuvastatin (CRESTOR) 10 MG tablet Take 1 tablet (10 mg total) by mouth daily. 05/09/22   Corwin Levins, MD  triamcinolone (NASACORT) 55 MCG/ACT AERO nasal inhaler Place 2 sprays into the nose daily. 03/20/21   Corwin Levins, MD      Allergies    Aspirin    Review of Systems   Review of Systems  All other systems reviewed and are negative.   Physical Exam Updated Vital Signs BP (!) 143/79 (BP Location: Left Arm)   Pulse 69   Temp 98.2 F (36.8 C) (Oral)   Resp 15   SpO2 99%  Physical Exam Vitals and nursing note reviewed.  Constitutional:      General: She is not in acute distress.    Appearance: Normal appearance. She is well-developed. She is not toxic-appearing.  HENT:  Head: Normocephalic and atraumatic.   Eyes:     General: Lids are normal.     Conjunctiva/sclera: Conjunctivae normal.     Pupils: Pupils are equal, round, and reactive to light.  Neck:     Thyroid: No thyroid mass.     Trachea: No tracheal deviation.  Cardiovascular:     Rate and Rhythm: Normal rate and regular rhythm.     Heart sounds: Normal heart sounds. No murmur heard.    No gallop.  Pulmonary:     Effort: Pulmonary effort is normal. No respiratory distress.     Breath sounds: Normal breath sounds. No stridor. No decreased breath sounds, wheezing, rhonchi or rales.  Abdominal:     General: There is no distension.     Palpations: Abdomen is soft.     Tenderness:  There is no abdominal tenderness. There is no rebound.  Musculoskeletal:        General: No tenderness. Normal range of motion.     Cervical back: Normal range of motion and neck supple.  Skin:    General: Skin is warm and dry.     Findings: No abrasion or rash.  Neurological:     General: No focal deficit present.     Mental Status: She is alert and oriented to person, place, and time. Mental status is at baseline.     GCS: GCS eye subscore is 4. GCS verbal subscore is 5. GCS motor subscore is 6.     Cranial Nerves: Cranial nerves are intact. No cranial nerve deficit.     Sensory: No sensory deficit.     Motor: Motor function is intact.     Gait: Gait is intact.  Psychiatric:        Attention and Perception: Attention normal.        Speech: Speech normal.        Behavior: Behavior normal.    ED Results / Procedures / Treatments   Labs (all labs ordered are listed, but only abnormal results are displayed) Labs Reviewed - No data to display  EKG None  Radiology No results found.  Procedures Procedures    Medications Ordered in ED Medications  metoCLOPramide (REGLAN) injection 10 mg (has no administration in time range)  diphenhydrAMINE (BENADRYL) injection 12.5 mg (has no administration in time range)    ED Course/ Medical Decision Making/ A&P                                 Medical Decision Making Risk Prescription drug management.   Patient treated for migraine headache here with Reglan Benadryl feels much better.  No indication for neuroimaging at this time.  Has normal neurological exam.  Low suspicion for subarachnoid or meningitis.  Will discharge home        Final Clinical Impression(s) / ED Diagnoses Final diagnoses:  None    Rx / DC Orders ED Discharge Orders     None         Lorre Nick, MD 10/12/22 1649

## 2022-10-23 ENCOUNTER — Telehealth: Payer: Self-pay

## 2022-10-23 NOTE — Telephone Encounter (Signed)
Transition Care Management Follow-up Telephone Call Date of discharge and from where: 10/12/2022 [facility] How have you been since you were released from the hospital? Patient stated that her headache lingered for a few days but is much better now.  She feels it was due to stress. Any questions or concerns? No  Items Reviewed: Did the pt receive and understand the discharge instructions provided? Yes  Medications obtained and verified?  No medication prescribed. Other? No  Any new allergies since your discharge? No  Dietary orders reviewed? Yes Do you have support at home? Yes   Follow up appointments reviewed:  PCP Hospital f/u appt confirmed?  Patient stated that she will follow-up this week.  Scheduled to see  on  @ . Specialist Hospital f/u appt confirmed? No  Scheduled to see  on  @ . Are transportation arrangements needed? No  If their condition worsens, is the pt aware to call PCP or go to the Emergency Dept.? Yes Was the patient provided with contact information for the PCP's office or ED? Yes Was to pt encouraged to call back with questions or concerns? Yes  Kaspian Muccio [facility]  [facility] Population Health Community Resource Care Guide   [email or web address]  ?? [phone]   Website: [email or web address]  [email or web address]

## 2022-10-31 ENCOUNTER — Telehealth: Payer: Federal, State, Local not specified - PPO | Admitting: Nurse Practitioner

## 2022-10-31 VITALS — BP 124/68 | Temp 98.8°F

## 2022-10-31 DIAGNOSIS — J329 Chronic sinusitis, unspecified: Secondary | ICD-10-CM | POA: Diagnosis not present

## 2022-10-31 MED ORDER — AZITHROMYCIN 250 MG PO TABS: ORAL_TABLET | ORAL | 0 refills | Status: AC

## 2022-10-31 NOTE — Assessment & Plan Note (Signed)
Acute We had a shared decision making regarding symptom management alone versus antihistamine versus antibiotic treatment plan.  Because her symptoms have been present for well over a week and they first improved but then reoccurred I do think it is reasonable to treat with an antibiotic and patient would prefer this.  We will treat with course of azithromycin, patient told that if symptoms do not improve that she should call office for reevaluation.  She reports her understanding.

## 2022-10-31 NOTE — Progress Notes (Addendum)
Established Patient Office Visit  An audio-only tele-health visit was completed today for this patient. I connected with  Miranda Green on 10/31/22 utilizing audio-only technology and verified that I am speaking with the correct person using two identifiers. The patient was located at their home, and I was located at the office of [facility] at [facility] during the encounter. I discussed the limitations of evaluation and management by telemedicine. The patient expressed understanding and agreed to proceed.    Subjective   Patient ID: Miranda Green, female    DOB: [birth year]  Age: 62 y.o. MRN: [id number]  Chief Complaint  Patient presents with   Sinus Problem    Patient arrives for virtual visit for the above.  Unfortunately her technology was not working so we are only able to do an audio visit.  She could see me on video, but she could not get her camera or microphone to work so we talked over the phone for her visit.  She was notified that insurance may not cover this visit as video technology was not used, she expressed her understanding and wished to proceed.  Symptoms initially started over a week ago.  They resolved but then reoccurred.  She is experiencing mild headache, sore throat, but significant amount of nasal congestion and she did report some chills.  She took an at home COVID test which was negative.  Has been using herbal tea which has helped some of her sore throat.  No coughing, wheezing, shortness of Green, chest pain.    Review of Systems  Constitutional:  Positive for chills.  HENT:  Positive for congestion. Negative for sore throat.        (+) nasal drainage  Respiratory:  Negative for cough, shortness of Green and wheezing.   Cardiovascular:  Negative for chest pain and palpitations.  Neurological:  Negative for headaches.      Objective:     BP 124/68   Temp 98.8 F (37.1 C)    Physical Exam Comprehensive physical exam not completed  today as office visit was conducted remotely.  Patient sounded well over phone, no signs of acute distress were noted, she did sound congested.  Patient was alert and oriented, and appeared to have appropriate judgment.   No results found for any visits on 10/31/22.    The 10-year ASCVD risk score (Arnett DK, et al., 2019) is: 6.7%    Assessment & Plan:   Problem List Items Addressed This Visit       Respiratory   Rhinosinusitis - Primary    Acute We had a shared decision making regarding symptom management alone versus antihistamine versus antibiotic treatment plan.  Because her symptoms have been present for well over a week and they first improved but then reoccurred I do think it is reasonable to treat with an antibiotic and patient would prefer this.  We will treat with course of azithromycin, patient told that if symptoms do not improve that she should call office for reevaluation.  She reports her understanding.      Relevant Medications   azithromycin (ZITHROMAX) 250 MG tablet    Return if symptoms worsen or fail to improve. Total time spent on this encounter was 10 minutes.   Elenore Paddy, NP

## 2022-11-13 NOTE — Addendum Note (Signed)
Addended by: Jiles Prows E on: 11/13/2022 12:12 PM   Modules accepted: Level of Service

## 2022-11-26 ENCOUNTER — Other Ambulatory Visit: Payer: Self-pay | Admitting: Internal Medicine

## 2022-11-26 DIAGNOSIS — Z1231 Encounter for screening mammogram for malignant neoplasm of breast: Secondary | ICD-10-CM

## 2022-12-31 ENCOUNTER — Ambulatory Visit: Payer: Federal, State, Local not specified - PPO

## 2022-12-31 ENCOUNTER — Ambulatory Visit
Admission: RE | Admit: 2022-12-31 | Discharge: 2022-12-31 | Disposition: A | Discharge status: Home / Self Care | Payer: Federal, State, Local not specified - PPO | Source: Ambulatory Visit | Attending: Internal Medicine | Admitting: Internal Medicine

## 2022-12-31 DIAGNOSIS — Z1231 Encounter for screening mammogram for malignant neoplasm of breast: Secondary | ICD-10-CM | POA: Diagnosis not present

## 2023-01-01 ENCOUNTER — Ambulatory Visit: Payer: Federal, State, Local not specified - PPO

## 2023-05-10 ENCOUNTER — Other Ambulatory Visit: Payer: Self-pay | Admitting: Internal Medicine

## 2023-05-12 ENCOUNTER — Other Ambulatory Visit: Payer: Self-pay

## 2023-05-12 DIAGNOSIS — K080 Exfoliation of teeth due to systemic causes: Secondary | ICD-10-CM | POA: Diagnosis not present

## 2023-06-23 ENCOUNTER — Encounter: Admitting: Internal Medicine

## 2023-07-09 ENCOUNTER — Ambulatory Visit (INDEPENDENT_AMBULATORY_CARE_PROVIDER_SITE_OTHER): Admitting: Internal Medicine

## 2023-07-09 ENCOUNTER — Encounter: Payer: Self-pay | Admitting: Internal Medicine

## 2023-07-09 VITALS — BP 148/96 | HR 65 | Temp 98.4°F | Ht 65.0 in | Wt 180.0 lb

## 2023-07-09 DIAGNOSIS — E538 Deficiency of other specified B group vitamins: Secondary | ICD-10-CM | POA: Diagnosis not present

## 2023-07-09 DIAGNOSIS — R739 Hyperglycemia, unspecified: Secondary | ICD-10-CM | POA: Diagnosis not present

## 2023-07-09 DIAGNOSIS — L309 Dermatitis, unspecified: Secondary | ICD-10-CM

## 2023-07-09 DIAGNOSIS — E7800 Pure hypercholesterolemia, unspecified: Secondary | ICD-10-CM | POA: Diagnosis not present

## 2023-07-09 DIAGNOSIS — E559 Vitamin D deficiency, unspecified: Secondary | ICD-10-CM

## 2023-07-09 DIAGNOSIS — Z0001 Encounter for general adult medical examination with abnormal findings: Secondary | ICD-10-CM

## 2023-07-09 LAB — HEMOGLOBIN A1C: Hgb A1c MFr Bld: 5.7 % (ref 4.6–6.5)

## 2023-07-09 LAB — LIPID PANEL
Cholesterol: 221 mg/dL — ABNORMAL HIGH (ref 0–200)
HDL: 46.5 mg/dL (ref >39.00)
LDL Cholesterol: 159 mg/dL — ABNORMAL HIGH (ref 0–99)
NonHDL: 174.02
Total CHOL/HDL Ratio: 5
Triglycerides: 77 mg/dL (ref 0.0–149.0)
VLDL: 15.4 mg/dL (ref 0.0–40.0)

## 2023-07-09 LAB — CBC WITH DIFFERENTIAL/PLATELET
Basophils Absolute: 0.1 10*3/uL (ref 0.0–0.1)
Basophils Relative: 1.1 % (ref 0.0–3.0)
Eosinophils Absolute: 0.2 10*3/uL (ref 0.0–0.7)
Eosinophils Relative: 3.7 % (ref 0.0–5.0)
HCT: 40 % (ref 36.0–46.0)
Hemoglobin: 13.3 g/dL (ref 12.0–15.0)
Lymphocytes Relative: 30.9 % (ref 12.0–46.0)
Lymphs Abs: 1.5 10*3/uL (ref 0.7–4.0)
MCHC: 33.2 g/dL (ref 30.0–36.0)
MCV: 85.6 fl (ref 78.0–100.0)
Monocytes Absolute: 0.4 10*3/uL (ref 0.1–1.0)
Monocytes Relative: 8.8 % (ref 3.0–12.0)
Neutro Abs: 2.7 10*3/uL (ref 1.4–7.7)
Neutrophils Relative %: 55.5 % (ref 43.0–77.0)
Platelets: 235 10*3/uL (ref 150.0–400.0)
RBC: 4.67 Mil/uL (ref 3.87–5.11)
RDW: 13.8 % (ref 11.5–15.5)
WBC: 4.9 10*3/uL (ref 4.0–10.5)

## 2023-07-09 LAB — BASIC METABOLIC PANEL WITH GFR
BUN: 15 mg/dL (ref 6–23)
CO2: 27 meq/L (ref 19–32)
Calcium: 9.4 mg/dL (ref 8.4–10.5)
Chloride: 105 meq/L (ref 96–112)
Creatinine, Ser: 0.81 mg/dL (ref 0.40–1.20)
GFR: 77.23 mL/min (ref >60.00)
Glucose, Bld: 93 mg/dL (ref 70–99)
Potassium: 4.1 meq/L (ref 3.5–5.1)
Sodium: 140 meq/L (ref 135–145)

## 2023-07-09 LAB — URINALYSIS, ROUTINE W REFLEX MICROSCOPIC
Bilirubin Urine: NEGATIVE
Hgb urine dipstick: NEGATIVE
Ketones, ur: NEGATIVE
Leukocytes,Ua: NEGATIVE
Nitrite: NEGATIVE
RBC / HPF: NONE SEEN (ref 0–?)
Specific Gravity, Urine: 1.02 (ref 1.000–1.030)
Total Protein, Urine: NEGATIVE
Urine Glucose: NEGATIVE
Urobilinogen, UA: 0.2 (ref 0.0–1.0)
pH: 6.5 (ref 5.0–8.0)

## 2023-07-09 LAB — VITAMIN B12: Vitamin B-12: 691 pg/mL (ref 211–911)

## 2023-07-09 LAB — HEPATIC FUNCTION PANEL
ALT: 13 U/L (ref 0–35)
AST: 17 U/L (ref 0–37)
Albumin: 4.6 g/dL (ref 3.5–5.2)
Alkaline Phosphatase: 36 U/L — ABNORMAL LOW (ref 39–117)
Bilirubin, Direct: 0.2 mg/dL (ref 0.0–0.3)
Total Bilirubin: 0.7 mg/dL (ref 0.2–1.2)
Total Protein: 7.5 g/dL (ref 6.0–8.3)

## 2023-07-09 LAB — VITAMIN D 25 HYDROXY (VIT D DEFICIENCY, FRACTURES): VITD: 15.02 ng/mL — ABNORMAL LOW (ref 30.00–100.00)

## 2023-07-09 LAB — TSH: TSH: 1.34 u[IU]/mL (ref 0.35–5.50)

## 2023-07-09 MED ORDER — TRIAMCINOLONE ACETONIDE 0.1 % EX CREA: 1.0000 | TOPICAL_CREAM | Freq: Two times a day (BID) | CUTANEOUS | 1 refills | Status: AC

## 2023-07-09 NOTE — Assessment & Plan Note (Signed)
Last vitamin D Lab Results  Component Value Date   VD25OH 17.56 (L) 05/08/2022   Low, to start oral replacement

## 2023-07-09 NOTE — Patient Instructions (Signed)
 Please take all new medication as prescribed   - the triamcinolone  cream (jar)  Please continue all other medications as before, and refills have been done if requested.  Please have the pharmacy call with any other refills you may need.  Please continue your efforts at being more active, low cholesterol diet, and weight control.  You are otherwise up to date with prevention measures today.  Please keep your appointments with your specialists as you may have planned  Please go to the LAB at the blood drawing area for the tests to be done  You will be contacted by phone if any changes need to be made immediately.  Otherwise, you will receive a letter about your results with an explanation, but please check with MyChart first.  Please make an Appointment to return for your 1 year visit, or sooner if needed, with Lab testing by Appointment as well, to be done about 3-5 days before at the FIRST FLOOR Lab (so this is for TWO appointments - please see the scheduling desk as you leave)

## 2023-07-09 NOTE — Progress Notes (Signed)
 Patient ID: Miranda Green, female   DOB: July 20, 1960, 63 y.o.   MRN: [id number]         Chief Complaint:: wellness exam and htn, low vit d, hld, eczema       HPI:  Miranda Green is a 63 y.o. 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   Stressed today with jittery.  Does not want statin ,  BP more controlled at home.  Pt denies chest pain, increased sob or doe, wheezing, orthopnea, PND, increased LE swelling, palpitations, dizziness or syncope.   Pt denies polydipsia, polyuria, or new focal neuro s/s.    Pt denies fever, wt loss, night sweats, loss of appetite, or other constitutional symptoms  Also has worsening eczema controlled before with topical steroid, asking for restart.  .     Wt Readings from Last 3 Encounters:  07/09/23 180 lb (81.6 kg)  05/08/22 180 lb (81.6 kg)  07/18/21 179 lb (81.2 kg)   BP Readings from Last 3 Encounters:  07/09/23 (!) 148/96  10/31/22 124/68  10/12/22 128/71   Immunization History  Administered Date(s) Administered   Influenza Inj Mdck Quad Pf 12/10/2016, 02/22/2022   Influenza Split 12/02/2012   Influenza,inj,Quad PF,6+ Mos 03/05/2018, 12/02/2018   Influenza-Unspecified 12/10/2016   Moderna SARS-COV2 Booster Vaccination 11/22/2019   Moderna Sars-Covid-2 Vaccination 04/21/2019, 05/19/2019   Tdap 07/01/2018   Zoster Recombinant(Shingrix) 06/19/2020, 03/20/2021   Health Maintenance Due  Topic Date Due   Cervical Cancer Screening (HPV/Pap Cotest)  Never done      Past Medical History:  Diagnosis Date   ALLERGIC RHINITIS 10/13/2006   ANEMIA-IRON DEFICIENCY 10/13/2006   resolved after had hysterectomy   ANXIETY 10/13/2006   ASTHMA 10/13/2006   Asthma    Bleeds easily (HCC)    Has had problems when has surgery and birthing   DEPRESSION 10/13/2006   resolved   Hernia of  abdominal wall    HYPERLIPIDEMIA 10/13/2006   resolved   Migraines    Past Surgical History:  Procedure Laterality Date   ABDOMINAL HYSTERECTOMY     CESAREAN SECTION  1993   COLONOSCOPY     NECK SURGERY Left 06/05/2010   release pressure on nerve of left shoulder    reports that she has never smoked. She has never used smokeless tobacco. She reports current alcohol use of about 2.0 standard drinks of alcohol per week. She reports that she does not use drugs. family history includes Anuerysm in her father; Breast cancer (age of onset: 48 31 57) in her sister and another family member; Colon polyps in her sister; Diabetes in her brother, father, mother, and sister; Heart disease in her mother; Hypertension in an other family member. Allergies  Allergen Reactions   Aspirin  Nausea Only   Current Outpatient Medications on File Prior to Visit  Medication Sig Dispense Refill   acetaminophen  (TYLENOL ) 325 MG tablet Take 325-650 mg by mouth every 6 (six) hours as needed (for pain).      albuterol  (VENTOLIN  HFA) 108 (90 Base) MCG/ACT inhaler Inhale 2 puffs into the lungs every 6 (six) hours as needed for wheezing or shortness of breath. 8 g 11   aspirin -acetaminophen -caffeine (EXCEDRIN MIGRAINE) 250-250-65  MG tablet Take 1 tablet by mouth every 6 (six) hours as needed for headache or migraine.     EPINEPHrine  0.3 mg/0.3 mL IJ SOAJ injection Inject 0.3 mg into the muscle as needed for anaphylaxis. 1 each 3   fluticasone  (FLONASE ) 50 MCG/ACT nasal spray Place 2 sprays into both nostrils daily. 16 g 6   meloxicam (MOBIC) 15 MG tablet Take by mouth.     Multiple Vitamins-Minerals (MULTIVITAMIN WOMEN 50+) TABS Take 1 tablet by mouth daily.     nystatin  (MYCOSTATIN /NYSTOP ) powder Use as directed twice per day as needed 60 g 1   No current facility-administered medications on file prior to visit.        ROS:  All others reviewed and negative.  Objective        PE:  BP (!) 148/96 (BP Location: Left  Arm, Patient Position: Sitting, Cuff Size: Normal)   Pulse 65   Temp 98.4 F (36.9 C) (Oral)   Ht 5\' 5"  (1.651 m)   Wt 180 lb (81.6 kg)   SpO2 97%   BMI 29.95 kg/m                 Constitutional: Pt appears in NAD               HENT: Head: NCAT.                Right Ear: External ear normal.                 Left Ear: External ear normal.                Eyes: . Pupils are equal, round, and reactive to light. Conjunctivae and EOM are normal               Nose: without d/c or deformity               Neck: Neck supple. Gross normal ROM               Cardiovascular: Normal rate and regular rhythm.                 Pulmonary/Chest: Effort normal and breath sounds without rales or wheezing.                Abd:  Soft, NT, ND, + BS, no organomegaly               Neurological: Pt is alert. At baseline orientation, motor grossly intact               Skin: Skin is warm. + eczema rashes, LE edema - none               Psychiatric: Pt behavior is normal without agitation   Micro: none  Cardiac tracings I have personally interpreted today:  none  Pertinent Radiological findings (summarize): none   Lab Results  Component Value Date   WBC 4.7 05/08/2022   HGB 13.3 05/08/2022   HCT 40.2 05/08/2022   PLT 250.0 05/08/2022   GLUCOSE 86 05/08/2022   CHOL 217 (H) 05/08/2022   TRIG 56.0 05/08/2022   HDL 44.00 05/08/2022   LDLCALC 162 (H) 05/08/2022   ALT 14 05/08/2022   AST 18 05/08/2022   NA 140 05/08/2022   K 3.9 05/08/2022   CL 104 05/08/2022   CREATININE 0.82 05/08/2022   BUN 13 05/08/2022   CO2 27 05/08/2022   TSH 1.15 05/08/2022   INR 0.94  05/04/2017   HGBA1C 5.6 05/08/2022   MICROALBUR 0.7 05/08/2022   Assessment/Plan:  Miranda Green is a 63 y.o. Black or African American [2] female with  has a past medical history of ALLERGIC RHINITIS (10/13/2006), ANEMIA-IRON DEFICIENCY (10/13/2006), ANXIETY (10/13/2006), ASTHMA (10/13/2006), Asthma, Bleeds easily (HCC), DEPRESSION  (10/13/2006), Hernia of abdominal wall, HYPERLIPIDEMIA (10/13/2006), and Migraines.  Encounter for well adult exam with abnormal findings Age and sex appropriate education and counseling updated with regular exercise and diet Referrals for preventative services - pt to call for Gyn soon Immunizations addressed - declines prevnar 20 Smoking counseling  - none needed Evidence for depression or other mood disorder - none significant Most recent labs reviewed. I have personally reviewed and have noted: 1) the patient's medical and social history 2) The patient's current medications and supplements 3) The patient's height, weight, and BMI have been recorded in the chart   Hyperlipidemia Lab Results  Component Value Date   LDLCALC 162 (H) 05/08/2022   Severe uncontrolled but declines statin with recent cardiac CT score of zero   Vitamin D  deficiency Last vitamin D  Lab Results  Component Value Date   VD25OH 17.56 (L) 05/08/2022   Low, to start oral replacement   Hyperglycemia Lab Results  Component Value Date   HGBA1C 5.6 05/08/2022   Stable, pt to continue current medical treatment  - diet, wt control   Eczema With recent worsening, for triam Cr prn asd  Followup: Return in about 1 year (around 07/08/2024).  Rosalia Colonel, MD 07/09/2023 1:21 PM [facility]

## 2023-07-09 NOTE — Assessment & Plan Note (Signed)
 Lab Results  Component Value Date   LDLCALC 162 (H) 05/08/2022   Severe uncontrolled but declines statin with recent cardiac CT score of zero

## 2023-07-09 NOTE — Assessment & Plan Note (Signed)
 With recent worsening, for triam Cr prn asd

## 2023-07-09 NOTE — Assessment & Plan Note (Signed)
 Age and sex appropriate education and counseling updated with regular exercise and diet Referrals for preventative services - pt to call for Gyn soon Immunizations addressed - declines prevnar 20 Smoking counseling  - none needed Evidence for depression or other mood disorder - none significant Most recent labs reviewed. I have personally reviewed and have noted: 1) the patient's medical and social history 2) The patient's current medications and supplements 3) The patient's height, weight, and BMI have been recorded in the chart

## 2023-07-09 NOTE — Assessment & Plan Note (Signed)
Lab Results  Component Value Date   HGBA1C 5.6 05/08/2022   Stable, pt to continue current medical treatment  - diet,wt control

## 2023-07-10 ENCOUNTER — Ambulatory Visit: Payer: Self-pay | Admitting: Internal Medicine

## 2023-10-14 ENCOUNTER — Emergency Department (HOSPITAL_BASED_OUTPATIENT_CLINIC_OR_DEPARTMENT_OTHER)
Admission: EM | Admit: 2023-10-14 | Discharge: 2023-10-14 | Disposition: A | Discharge status: Home / Self Care | Attending: Emergency Medicine | Admitting: Emergency Medicine

## 2023-10-14 ENCOUNTER — Emergency Department (HOSPITAL_BASED_OUTPATIENT_CLINIC_OR_DEPARTMENT_OTHER): Admitting: Radiology

## 2023-10-14 ENCOUNTER — Telehealth: Payer: Self-pay

## 2023-10-14 ENCOUNTER — Other Ambulatory Visit: Payer: Self-pay

## 2023-10-14 DIAGNOSIS — R0602 Shortness of breath: Secondary | ICD-10-CM | POA: Diagnosis not present

## 2023-10-14 DIAGNOSIS — T59891A Toxic effect of other specified gases, fumes and vapors, accidental (unintentional), initial encounter: Secondary | ICD-10-CM | POA: Diagnosis not present

## 2023-10-14 DIAGNOSIS — Z77098 Contact with and (suspected) exposure to other hazardous, chiefly nonmedicinal, chemicals: Secondary | ICD-10-CM | POA: Diagnosis not present

## 2023-10-14 DIAGNOSIS — J029 Acute pharyngitis, unspecified: Secondary | ICD-10-CM | POA: Insufficient documentation

## 2023-10-14 DIAGNOSIS — J689 Unspecified respiratory condition due to chemicals, gases, fumes and vapors: Secondary | ICD-10-CM | POA: Diagnosis not present

## 2023-10-14 MED ORDER — ALBUTEROL SULFATE HFA 108 (90 BASE) MCG/ACT IN AERS
2.0000 | INHALATION_SPRAY | Freq: Once | RESPIRATORY_TRACT | Status: AC
  Administered 2023-10-14: 2 via RESPIRATORY_TRACT
  Filled 2023-10-14: qty 6.7

## 2023-10-14 NOTE — Discharge Instructions (Signed)
 Please read and follow all provided instructions.  Your diagnoses today include:  1. Inhalation injury due to chemical [facility])     Tests performed today include: Chest x-ray: No concerning findings Vital signs. See below for your results today.   Medications prescribed:  Albuterol  inhaler - medication that opens up your airway  Use inhaler as follows: 1-2 puffs with spacer every 4 hours as needed for wheezing, cough, or shortness of breath.   Take any prescribed medications only as directed.  Home care instructions:  Follow any educational materials contained in this packet.  Please avoid other chemical exposures to allow your airways time to heal.  Follow-up instructions: Please follow-up with your primary care provider in the next week as needed for further evaluation of your symptoms.   Return instructions:  Please return with worsening shortness of breath, difficulty breathing or swallowing, new or worsening symptoms. Please return if you have any other emergent concerns.  Additional Information:  Your vital signs today were: BP (!) 155/75 (BP Location: Right Arm)   Pulse 68   Temp 98.5 F (36.9 C) (Oral)   Resp 16   SpO2 100%  If your blood pressure (BP) was elevated above 135/85 this visit, please have this repeated by your doctor within one month. --------------

## 2023-10-14 NOTE — Telephone Encounter (Signed)
 Patient states she inhaled ammonia and feels burning in her throat and that her throat is closing up. This RN advised patient go to ED immediately or call 911. Patient declined 911 but stated family member will take her to ED now. Patient will call back after with any concerns.    Copied from CRM #[id number]. Topic: Clinical - Red Word Triage >> Oct 14, 2023  9:33 AM Ismael A wrote: Red Word that prompted transfer to Nurse Triage: Patient experiencing burning sensation in nasal cavity, swelling in throat, some difficulty breathing

## 2023-10-14 NOTE — ED Triage Notes (Signed)
 Patient states she was cleaning yesterday and breathed in some ammonia. States this morning she's having throat irritation and loss of smell.

## 2023-10-14 NOTE — ED Provider Notes (Signed)
 [place] EMERGENCY DEPARTMENT AT [facility] Provider Note   CSN: [id number] Arrival date & time: 10/14/23  1048     Patient presents with: Chemical Exposure   Miranda Green is a 63 y.o. female.   Patient presents to the emergency department today for evaluation of sore throat and some shortness of breath.  Patient was cleaning yesterday.  She states that she was using ammonia.  She states that she added some water to ammonia and she was exposed to a cloud of fumes.  Approximately 10 minutes later she developed some irritation in the back of her throat.  Patient has a history of asthma.  This morning she felt that she could not exhale well.  She did not have her albuterol  inhaler with her.  She reports sore throat, loss of smell.  No significant wheezing.  Otherwise no ear pain, runny nose, cough.  She reports mild shortness of breath.  Talking normally.  She states that warm liquids irritate the back of her throat.       Prior to Admission medications   Medication Sig Start Date End Date Taking? Authorizing Provider  acetaminophen  (TYLENOL ) 325 MG tablet Take 325-650 mg by mouth every 6 (six) hours as needed (for pain).     [provider]  albuterol  (VENTOLIN  HFA) 108 (90 Base) MCG/ACT inhaler Inhale 2 puffs into the lungs every 6 (six) hours as needed for wheezing or shortness of breath. 03/20/21   Norleen Lynwood ORN, MD  aspirin -acetaminophen -caffeine (EXCEDRIN MIGRAINE) 250-250-65 MG tablet Take 1 tablet by mouth every 6 (six) hours as needed for headache or migraine.    [provider]  EPINEPHrine  0.3 mg/0.3 mL IJ SOAJ injection Inject 0.3 mg into the muscle as needed for anaphylaxis. 03/20/21   Norleen Lynwood ORN, MD  fluticasone  (FLONASE ) 50 MCG/ACT nasal spray Place 2 sprays into both nostrils daily. 11/22/21   Sebastian Beverley NOVAK, MD  meloxicam [facility]) 15 MG tablet Take by mouth. 04/14/23   [provider]  Multiple Vitamins-Minerals (MULTIVITAMIN WOMEN  50+) TABS Take 1 tablet by mouth daily.    [provider]  nystatin  (MYCOSTATIN /NYSTOP ) powder Use as directed twice per day as needed 05/08/22   Norleen Lynwood ORN, MD  triamcinolone  cream (KENALOG ) 0.1 % Apply 1 Application topically 2 (two) times daily. 07/09/23   Norleen Lynwood ORN, MD    Allergies: Aspirin     Review of Systems  Updated Vital Signs BP (!) 155/75 (BP Location: Right Arm)   Pulse 68   Temp 98.5 F (36.9 C) (Oral)   Resp 16   SpO2 100%   Physical Exam Vitals and nursing note reviewed.  Constitutional:      Appearance: She is well-developed.  HENT:     Head: Normocephalic and atraumatic.     Right Ear: Tympanic membrane, ear canal and external ear normal.     Left Ear: Tympanic membrane, ear canal and external ear normal.     Nose: Nose normal. No congestion or rhinorrhea.     Mouth/Throat:     Mouth: Mucous membranes are moist.     Pharynx: Posterior oropharyngeal erythema (mild) present.     Comments: No edema Eyes:     Conjunctiva/sclera: Conjunctivae normal.  Cardiovascular:     Rate and Rhythm: Normal rate.  Pulmonary:     Effort: No respiratory distress.     Comments: Lung sounds normal, no wheezing, speaks in full sentences without difficulty. Musculoskeletal:     Cervical back: Normal  range of motion and neck supple.  Skin:    General: Skin is warm and dry.  Neurological:     Mental Status: She is alert.     (all labs ordered are listed, but only abnormal results are displayed) Labs Reviewed - No data to display  EKG: None  Radiology: DG Chest 2 View Result Date: 10/14/2023 CLINICAL DATA:  Shortness of breath.  Inhaled pneumonia yesterday. EXAM: CHEST - 2 VIEW COMPARISON:  May 31, 2010. FINDINGS: The heart size and mediastinal contours are within normal limits. Both lungs are clear. The visualized skeletal structures are unremarkable. IMPRESSION: No active cardiopulmonary disease. Electronically Signed   By: Lynwood Landy Raddle M.D.   On:  10/14/2023 12:12     Procedures   Medications Ordered in the ED  albuterol  (VENTOLIN  HFA) 108 (90 Base) MCG/ACT inhaler 2 puff (has no administration in time range)   ED Course  Patient seen and examined. History obtained directly from patient.   Labs/EKG: None ordered  Imaging: Ordered chest x-ray.  Medications/Fluids: Ordered: Albuterol  HFA 2 puffs.   Most recent vital signs reviewed and are as follows: BP (!) 155/75 (BP Location: Right Arm)   Pulse 68   Temp 98.5 F (36.9 C) (Oral)   Resp 16   SpO2 100%   Initial impression: Respiratory irritation from recent chemical inhalation, symptoms are mild, no respiratory distress or impending airway compromise.  Given history of asthma, may benefit from inhaler.  Chest x-ray pending.  12:23 PM Reassessment performed. Patient appears stable, comfortable.  No distress.  Imaging personally visualized and interpreted including: Chest x-ray agree negative.  Reviewed pertinent lab work and imaging with patient at bedside. Questions answered.   Most current vital signs reviewed and are as follows: BP (!) 155/75 (BP Location: Right Arm)   Pulse 62   Temp 98.5 F (36.9 C) (Oral)   Resp 16   SpO2 100%   Plan: Discharge to home.   Prescriptions written for: None  Other home care instructions discussed: Avoid respiratory irritants and food and drink that irritate the throat, avoid acidic liquids.  Use of albuterol  inhaler 2 puffs every 4 hours as needed for chest tightness or shortness of breath.  ED return instructions discussed: New or worsening symptoms, difficulty breathing or swallowing  Follow-up instructions discussed: Patient encouraged to follow-up with their PCP in 7 days if not improved.                                      Medical Decision Making Amount and/or Complexity of Data Reviewed Radiology: ordered.  Risk Prescription drug management.   Patient with inhalation injury yesterday, mild symptoms today.   Mainly sore throat and some chest tightness.  She does have asthma.  Lung exam is reassuring as well as chest x-ray.  Will continue symptomatic treatment.  Low concern for pneumonia or other etiology.  Posterior oropharynx appears clear without edema.  No concern for respiratory compromise.  The patient's vital signs, pertinent lab work and imaging were reviewed and interpreted as discussed in the ED course. Hospitalization was considered for further testing, treatments, or serial exams/observation. However as patient is well-appearing, has a stable exam, and reassuring studies today, I do not feel that they warrant admission at this time. This plan was discussed with the patient who verbalizes agreement and comfort with this plan and seems reliable and able to return to  the Emergency Department with worsening or changing symptoms.       Final diagnoses:  Inhalation injury due to chemical [facility])    ED Discharge Orders     None          Desiderio Chew, PA-C 10/14/23 1226    Pamella Sharper A, DO 10/15/23 1638

## 2023-12-08 ENCOUNTER — Other Ambulatory Visit: Payer: Self-pay | Admitting: Internal Medicine

## 2023-12-08 DIAGNOSIS — Z1231 Encounter for screening mammogram for malignant neoplasm of breast: Secondary | ICD-10-CM

## 2024-01-06 ENCOUNTER — Ambulatory Visit
Admission: RE | Admit: 2024-01-06 | Discharge: 2024-01-06 | Disposition: A | Discharge status: Home / Self Care | Source: Ambulatory Visit | Attending: Internal Medicine | Admitting: Internal Medicine

## 2024-01-06 DIAGNOSIS — Z1231 Encounter for screening mammogram for malignant neoplasm of breast: Secondary | ICD-10-CM | POA: Diagnosis not present
